# Patient Record
Sex: Female | Born: 1995 | Race: White | Hispanic: No | Marital: Single | Smoking: Never smoker
Health system: Southern US, Community
[De-identification: ages and names within clinical notes are randomized; demographics above are authoritative.]

## PROBLEM LIST (undated history)

## (undated) ENCOUNTER — Inpatient Hospital Stay (HOSPITAL_COMMUNITY): Payer: Self-pay

## (undated) DIAGNOSIS — F419 Anxiety disorder, unspecified: Secondary | ICD-10-CM

## (undated) DIAGNOSIS — Z789 Other specified health status: Secondary | ICD-10-CM

## (undated) DIAGNOSIS — F32A Depression, unspecified: Secondary | ICD-10-CM

---
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
## 2018-02-05 ENCOUNTER — Encounter: Payer: Self-pay | Admitting: Cardiovascular Disease

## 2018-02-06 ENCOUNTER — Encounter: Payer: Self-pay | Admitting: Cardiovascular Disease

## 2018-02-06 ENCOUNTER — Ambulatory Visit (INDEPENDENT_AMBULATORY_CARE_PROVIDER_SITE_OTHER): Payer: Medicaid Other | Admitting: Cardiovascular Disease

## 2018-02-06 VITALS — BP 112/82 | HR 89 | Ht 67.0 in | Wt 172.0 lb

## 2018-02-06 DIAGNOSIS — W57XXXA Bitten or stung by nonvenomous insect and other nonvenomous arthropods, initial encounter: Secondary | ICD-10-CM | POA: Diagnosis not present

## 2018-02-06 DIAGNOSIS — Z9289 Personal history of other medical treatment: Secondary | ICD-10-CM | POA: Diagnosis not present

## 2018-02-06 DIAGNOSIS — R9431 Abnormal electrocardiogram [ECG] [EKG]: Secondary | ICD-10-CM

## 2018-02-06 NOTE — Progress Notes (Signed)
CARDIOLOGY CONSULT NOTE  Patient ID: Jamie Maynard MRN: [id number] DOB/AGE: 24-Nov-1995 22 y.o.  Admit date: (Not on file) Primary Physician: Richardean Chimera, MD Referring Physician: Dr. Reuel Boom  Reason for Consultation: Abnormal ECG  HPI: Jamie Maynard is a 22 y.o. female who is being seen today for the evaluation of abnormal ECG at the request of Richardean Chimera, MD.   She was evaluated in the ED at [facility] on 02/04/18 for weakness and dizziness after a tick bite about a week ago.  I have personally reviewed all documentation, labs, radiographic and cardiovascular studies, and independently interpreted all ECG's.  According to the ED report, she experienced nausea, dizziness, weakness, headache, and itching on the abdomen at the site of the tick bite.  She was able to remove the tick but she was not sure how long the tick was embedded.  She denied fevers and chills as well as vomiting, chest pain, neck pain, shortness of breath, and palpitations.  She was mildly tachycardic at 112 bpm in the ED and blood pressure was normal.  Comprehensive metabolic panel was normal as was CBC and urinalysis.  I personally reviewed the ECG which demonstrated normal sinus rhythm, 97 bpm, with the automated ECG reading of baseline wandering inferior leads.  There were no significant abnormalities as per my review.  She has had chest heaviness ever since she was told she had to see a cardiologist.  She told me she did not have any chest pain or heaviness prior to this.  Apparently Lyme titers and additional titers for [facility] spotted fever were checked and are pending.   Family history: Father has a history of CABG, strokes, diabetes, and tobacco use.  Allergies  Allergen Reactions  . Other Hives and Rash    Glitter make-up causes rash and hives     No current outpatient medications on file.   No current facility-administered medications for this visit.     Past  Medical History:  Diagnosis Date  . ADHD (attention deficit hyperactivity disorder)     Past Surgical History:  Procedure Laterality Date  . TONSILLECTOMY AND ADENOIDECTOMY Bilateral 2003   Performed at [facility]    Social History   Socioeconomic History  . Marital status: Single    Spouse name: Not on file  . Number of children: Not on file  . Years of education: Not on file  . Highest education level: Not on file  Occupational History  . Not on file  Social Needs  . Financial resource strain: Not on file  . Food insecurity:    Worry: Not on file    Inability: Not on file  . Transportation needs:    Medical: Not on file    Non-medical: Not on file  Tobacco Use  . Smoking status: Passive Smoke Exposure - Never Smoker  . Smokeless tobacco: Never Used  Substance and Sexual Activity  . Alcohol use: No  . Drug use: No  . Sexual activity: Yes  Lifestyle  . Physical activity:    Days per week: Not on file    Minutes per session: Not on file  . Stress: Not on file  Relationships  . Social connections:    Talks on phone: Not on file    Gets together: Not on file    Attends religious service: Not on file    Active member of club or organization: Not on file    Attends  meetings of clubs or organizations: Not on file    Relationship status: Not on file  . Intimate partner violence:    Fear of current or ex partner: Not on file    Emotionally abused: Not on file    Physically abused: Not on file    Forced sexual activity: Not on file  Other Topics Concern  . Not on file  Social History Narrative   Jamie Maynard is in eleventh grade at [employer]. She has been diagnosed with ADHD and is an average student. 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    She lives with her father and 78eighteen year old brother.   HC: 54.8 cm      No outpatient medications have been marked as taking for the 02/06/18 encounter (Office  Visit) with Laqueta LindenKoneswaran, Laelyn Blumenthal A, MD.      Review of systems complete and found to be negative unless listed above in HPI    Physical exam Blood pressure 112/82, pulse 89, height 5\' 7"  (1.702 m), weight 172 lb (78 kg), SpO2 98 %. General: NAD Neck: No JVD, no thyromegaly or thyroid nodule.  Lungs: Clear to auscultation bilaterally with normal respiratory effort. CV: Nondisplaced PMI. Regular rate and rhythm, normal S1/S2, no S3/S4, no murmur.  No peripheral edema.  No carotid bruit.   Abdomen: Soft, nontender, no distention.  Skin: Intact without lesions or rashes.  Neurologic: Alert and oriented x 3.  Psych: Normal affect. Extremities: No clubbing or cyanosis.  HEENT: Normal.   ECG: Most recent ECG reviewed.   Labs: No results found for: K, BUN, CREATININE, ALT, TSH, HGB   Lipids: No results found for: LDLCALC, LDLDIRECT, CHOL, TRIG, HDL      ASSESSMENT AND PLAN:  1.  "Abnormal EKG ": I have reviewed the ECG and it is normal.  The automated reading mention baseline wandering in the inferior leads.  Intervals are normal.  No cardiac testing is indicated at this time.  2.  Tick bite: Lyme and [facility] spotted fever titers were apparently checked and are pending at this time.  I told her that if she is positive for Lyme she would likely require doxycycline.  I also told her that tick bites can sometimes lead to tachycardia or bradycardia.  She is presently experiencing neither.     Disposition: Follow up prn   Signed: Prentice DockerSuresh Kanylah Muench, M.D., F.A.C.C.  02/06/2018, 8:37 AM

## 2018-02-06 NOTE — Patient Instructions (Signed)
Medication Instructions:  Your physician recommends that you continue on your current medications as directed. Please refer to the Current Medication list given to you today.  Labwork: NONE  Testing/Procedures: NONE  Follow-Up: Your physician recommends that you schedule a follow-up appointment AS NEEDED WITH DR. KONESWARAN  Any Other Special Instructions Will Be Listed Below (If Applicable).  If you need a refill on your cardiac medications before your next appointment, please call your pharmacy. 

## 2018-05-04 ENCOUNTER — Emergency Department (HOSPITAL_COMMUNITY)
Admission: EM | Admit: 2018-05-04 | Discharge: 2018-05-04 | Disposition: A | Discharge status: Home / Self Care | Payer: Medicaid Other | Attending: Emergency Medicine | Admitting: Emergency Medicine

## 2018-05-04 ENCOUNTER — Encounter (HOSPITAL_COMMUNITY): Payer: Self-pay | Admitting: *Deleted

## 2018-05-04 ENCOUNTER — Other Ambulatory Visit: Payer: Self-pay

## 2018-05-04 DIAGNOSIS — R55 Syncope and collapse: Secondary | ICD-10-CM | POA: Diagnosis present

## 2018-05-04 DIAGNOSIS — Z7722 Contact with and (suspected) exposure to environmental tobacco smoke (acute) (chronic): Secondary | ICD-10-CM | POA: Insufficient documentation

## 2018-05-04 DIAGNOSIS — F909 Attention-deficit hyperactivity disorder, unspecified type: Secondary | ICD-10-CM | POA: Diagnosis not present

## 2018-05-04 LAB — CBC WITH DIFFERENTIAL/PLATELET
Basophils Absolute: 0.1 10*3/uL (ref 0.0–0.1)
Basophils Relative: 1 %
EOS ABS: 0 10*3/uL (ref 0.0–0.7)
Eosinophils Relative: 0 %
HCT: 43.6 % (ref 36.0–46.0)
HEMOGLOBIN: 14.2 g/dL (ref 12.0–15.0)
LYMPHS ABS: 2.1 10*3/uL (ref 0.7–4.0)
LYMPHS PCT: 22 %
MCH: 29.3 pg (ref 26.0–34.0)
MCHC: 32.6 g/dL (ref 30.0–36.0)
MCV: 89.9 fL (ref 78.0–100.0)
Monocytes Absolute: 0.7 10*3/uL (ref 0.1–1.0)
Monocytes Relative: 8 %
NEUTROS PCT: 69 %
Neutro Abs: 6.6 10*3/uL (ref 1.7–7.7)
Platelets: 283 10*3/uL (ref 150–400)
RBC: 4.85 MIL/uL (ref 3.87–5.11)
RDW: 12.8 % (ref 11.5–15.5)
WBC: 9.6 10*3/uL (ref 4.0–10.5)

## 2018-05-04 LAB — PREGNANCY, URINE: Preg Test, Ur: NEGATIVE

## 2018-05-04 LAB — HEPATIC FUNCTION PANEL
ALK PHOS: 57 U/L (ref 38–126)
ALT: 14 U/L (ref 0–44)
AST: 14 U/L — ABNORMAL LOW (ref 15–41)
Albumin: 4.7 g/dL (ref 3.5–5.0)
BILIRUBIN TOTAL: 0.4 mg/dL (ref 0.3–1.2)
Total Protein: 7.8 g/dL (ref 6.5–8.1)

## 2018-05-04 LAB — BASIC METABOLIC PANEL
Anion gap: 6 (ref 5–15)
BUN: 9 mg/dL (ref 6–20)
CHLORIDE: 106 mmol/L (ref 98–111)
CO2: 27 mmol/L (ref 22–32)
Calcium: 9.5 mg/dL (ref 8.9–10.3)
Creatinine, Ser: 0.67 mg/dL (ref 0.44–1.00)
GFR calc Af Amer: >60 mL/min (ref >60)
GFR calc non Af Amer: >60 mL/min (ref >60)
GLUCOSE: 85 mg/dL (ref 70–99)
POTASSIUM: 4 mmol/L (ref 3.5–5.1)
SODIUM: 139 mmol/L (ref 135–145)

## 2018-05-04 LAB — URINALYSIS, ROUTINE W REFLEX MICROSCOPIC
BILIRUBIN URINE: NEGATIVE
Glucose, UA: NEGATIVE mg/dL
Hgb urine dipstick: NEGATIVE
KETONES UR: NEGATIVE mg/dL
NITRITE: NEGATIVE
PH: 7 (ref 5.0–8.0)
Protein, ur: NEGATIVE mg/dL
SPECIFIC GRAVITY, URINE: 1.009 (ref 1.005–1.030)

## 2018-05-04 MED ORDER — SODIUM CHLORIDE 0.9 % IV BOLUS
1000.0000 mL | Freq: Once | INTRAVENOUS | Status: AC
  Administered 2018-05-04: 1000 mL via INTRAVENOUS

## 2018-05-04 NOTE — ED Triage Notes (Signed)
Pt states she passed out at work and not feeling well, asked a friend to come and her friend witnessed pt's loc.  Pt denies any hx of loc.  Pt states she has not been feeling well all morning with nausea and generalized weakness.

## 2018-05-04 NOTE — Discharge Instructions (Addendum)
Take your usual prescriptions as previously directed.  Increase your fluid intake (ie:  Gatoraide) for the next few days. Move slowly when changing positions.  Call your regular medical doctor Monday to schedule a follow up appointment this week.  Return to the Emergency Department immediately sooner if worsening.

## 2018-05-04 NOTE — ED Notes (Signed)
Pt given Sprite to drink for fluid challenge. Pt tolerating well.

## 2018-05-04 NOTE — ED Provider Notes (Signed)
[facility] EMERGENCY DEPARTMENT Provider Note   CSN: [id number] Arrival date & time: 05/04/18  1547     History   Chief Complaint Chief Complaint  Patient presents with  . Loss of Consciousness    witnessed    HPI Jamie Maynard is a 22 y.o. female.  HPI  Pt was seen at 1735. Per pt, c/o sudden onset and resolution of one episode of brief syncope that occurred PTA. Pt states she was in the bathroom, finished urinating and started to wash her hands. Pt states she then began to feel lightheaded.  States she texted her friend that she felt like she was going to pass out.  Pt then sat down on the floor as pt's friend arrived into the bathroom "and saw me pass out." Pt did not fall. Pt states she has been feeling generally weak and mildly nauseated since this morning. Denies abd pain, no N/V/D, no CP/palpitations, no SOB/cough, no headache, no visual changes, no focal motor weakness, no tingling/numbness in extremities, no ataxia, no slurred speech, no facial droop.    Past Medical History:  Diagnosis Date  . ADHD (attention deficit hyperactivity disorder)     Patient Active Problem List   Diagnosis Date Noted  . Urinary incontinence 09/08/2015  . Urinary retention 09/08/2015    Past Surgical History:  Procedure Laterality Date  . TONSILLECTOMY AND ADENOIDECTOMY Bilateral 2003   Performed at [facility]     OB History   None      Home Medications    Prior to Admission medications   Not on File    Family History Family History  Problem Relation Age of Onset  . Depression Father   . Heart attack Maternal Grandfather   . Congestive Heart Failure Paternal Grandmother   . Migraines Paternal Grandmother   . Depression Paternal Grandmother   . Lung cancer Paternal Grandfather   . Autism Cousin        2 paternal first cousins have autism  . ADD / ADHD Cousin        Several paternal first cousins have ADHD/ADD    Social History Social History     Tobacco Use  . Smoking status: Passive Smoke Exposure - Never Smoker  . Smokeless tobacco: Never Used  Substance Use Topics  . Alcohol use: No  . Drug use: No     Allergies   Other   Review of Systems Review of Systems ROS: Statement: All systems negative except as marked or noted in the HPI; Constitutional: Negative for fever and chills. +generalized weakness/fatigue.; ; Eyes: Negative for eye pain, redness and discharge. ; ; ENMT: Negative for ear pain, hoarseness, nasal congestion, sinus pressure and sore throat. ; ; Cardiovascular: Negative for chest pain, palpitations, diaphoresis, dyspnea and peripheral edema. ; ; Respiratory: Negative for cough, wheezing and stridor. ; ; Gastrointestinal: +nausea. Negative for vomiting, diarrhea, abdominal pain, blood in stool, hematemesis, jaundice and rectal bleeding. . ; ; Genitourinary: Negative for dysuria, flank pain and hematuria. ; ; Musculoskeletal: Negative for back pain and neck pain. Negative for swelling and trauma.; ; Skin: Negative for pruritus, rash, abrasions, blisters, bruising and skin lesion.; ; Neuro: Negative for headache and neck stiffness. Negative for extremity weakness, paresthesias, involuntary movement, seizure and +lightheadedness, syncope.      Physical Exam Updated Vital Signs BP 120/64   Pulse 86   Temp 98.5 F (36.9 C) (Oral)   Resp 16   Ht 5\' 7"  (1.702 m)  Wt 73.5 kg (162 lb)   LMP 04/13/2018   SpO2 100%   BMI 25.37 kg/m    Patient Vitals for the past 24 hrs:  BP Temp Temp src Pulse Resp SpO2 Height Weight  05/04/18 1845 120/64 - - 86 - 100 % - -  05/04/18 1701 108/70 - - 89 16 100 % - -  05/04/18 1700 108/70 - - - - - - -  05/04/18 1603 - 98.5 F (36.9 C) Oral 92 - 99 % - -  05/04/18 1602 - - - - - - 5\' 7"  (1.702 m) 73.5 kg (162 lb)  05/04/18 1601 123/77 98.5 F (36.9 C) - 90 18 99 % - -     18:44 Orthostatic Vital Signs VB  Orthostatic Lying   BP- Lying: 115/66   Pulse- Lying: 87        Orthostatic Sitting  BP- Sitting: 120/64   Pulse- Sitting: 90       Orthostatic Standing at 0 minutes  BP- Standing at 0 minutes: 131/71   Pulse- Standing at 0 minutes: 92      Physical Exam 1740: Physical examination:  Nursing notes reviewed; Vital signs and O2 SAT reviewed;  Constitutional: Well developed, Well nourished, Well hydrated, In no acute distress; Head:  Normocephalic, atraumatic; Eyes: EOMI, PERRL, No scleral icterus; ENMT: Mouth and pharynx normal, Mucous membranes moist; Neck: Supple, Full range of motion, No lymphadenopathy; Cardiovascular: Regular rate and rhythm, No gallop; Respiratory: Breath sounds clear & equal bilaterally, No wheezes.  Speaking full sentences with ease, Normal respiratory effort/excursion; Chest: Nontender, Movement normal; Abdomen: Soft, Nontender, Nondistended, Normal bowel sounds; Genitourinary: No CVA tenderness; Extremities: Peripheral pulses normal, No tenderness, No edema, No calf edema or asymmetry.; Neuro: AA&Ox3, Major CN grossly intact. No facial droop. Speech clear. No gross focal motor or sensory deficits in extremities.; Skin: Color normal, Warm, Dry.   ED Treatments / Results  Labs (all labs ordered are listed, but only abnormal results are displayed)   EKG EKG Interpretation  Date/Time:  Sunday May 04 2018 16:04:04 EDT Ventricular Rate:  87 PR Interval:  152 QRS Duration: 80 QT Interval:  344 QTC Calculation: 413 R Axis:   55 Text Interpretation:  Normal sinus rhythm Possible Left atrial enlargement No old tracing to compare Confirmed by Samuel Jester [phone]) on 05/04/2018 5:55:08 PM   Radiology   Procedures Procedures (including critical care time)  Medications Ordered in ED Medications  sodium chloride 0.9 % bolus 1,000 mL (0 mLs Intravenous Stopped 05/04/18 1852)     Initial Impression / Assessment and Plan / ED Course  I have reviewed the triage vital signs and the nursing notes.  Pertinent labs &  imaging results that were available during my care of the patient were reviewed by me and considered in my medical decision making (see chart for details).  MDM Reviewed: previous chart, nursing note and vitals Interpretation: labs and ECG   Results for orders placed or performed during the hospital encounter of 05/04/18  Urinalysis, Routine w reflex microscopic  Result Value Ref Range   Color, Urine YELLOW YELLOW   APPearance CLEAR CLEAR   Specific Gravity, Urine 1.009 1.005 - 1.030   pH 7.0 5.0 - 8.0   Glucose, UA NEGATIVE NEGATIVE mg/dL   Hgb urine dipstick NEGATIVE NEGATIVE   Bilirubin Urine NEGATIVE NEGATIVE   Ketones, ur NEGATIVE NEGATIVE mg/dL   Protein, ur NEGATIVE NEGATIVE mg/dL   Nitrite NEGATIVE NEGATIVE   Leukocytes, UA SMALL (A) NEGATIVE  RBC / HPF 0-5 0 - 5 RBC/hpf   WBC, UA 0-5 0 - 5 WBC/hpf   Bacteria, UA RARE (A) NONE SEEN   Squamous Epithelial / LPF 6-10 0 - 5  Pregnancy, urine  Result Value Ref Range   Preg Test, Ur NEGATIVE NEGATIVE  CBC with Differential  Result Value Ref Range   WBC 9.6 4.0 - 10.5 K/uL   RBC 4.85 3.87 - 5.11 MIL/uL   Hemoglobin 14.2 12.0 - 15.0 g/dL   HCT 09.843.6 11.936.0 - 14.746.0 %   MCV 89.9 78.0 - 100.0 fL   MCH 29.3 26.0 - 34.0 pg   MCHC 32.6 30.0 - 36.0 g/dL   RDW 82.912.8 56.211.5 - 13.015.5 %   Platelets 283 150 - 400 K/uL   Neutrophils Relative % 69 %   Neutro Abs 6.6 1.7 - 7.7 K/uL   Lymphocytes Relative 22 %   Lymphs Abs 2.1 0.7 - 4.0 K/uL   Monocytes Relative 8 %   Monocytes Absolute 0.7 0.1 - 1.0 K/uL   Eosinophils Relative 0 %   Eosinophils Absolute 0.0 0.0 - 0.7 K/uL   Basophils Relative 1 %   Basophils Absolute 0.1 0.0 - 0.1 K/uL  Basic metabolic panel  Result Value Ref Range   Sodium 139 135 - 145 mmol/L   Potassium 4.0 3.5 - 5.1 mmol/L   Chloride 106 98 - 111 mmol/L   CO2 27 22 - 32 mmol/L   Glucose, Bld 85 70 - 99 mg/dL   BUN 9 6 - 20 mg/dL   Creatinine, Ser 8.650.67 0.44 - 1.00 mg/dL   Calcium 9.5 8.9 - 78.410.3 mg/dL   GFR calc  non Af Amer >60 >60 mL/min   GFR calc Af Amer >60 >60 mL/min   Anion gap 6 5 - 15  Hepatic function panel  Result Value Ref Range   Total Protein 7.8 6.5 - 8.1 g/dL   Albumin 4.7 3.5 - 5.0 g/dL   AST 14 (L) 15 - 41 U/L   ALT 14 0 - 44 U/L   Alkaline Phosphatase 57 38 - 126 U/L   Total Bilirubin 0.4 0.3 - 1.2 mg/dL   Bilirubin, Direct <6.9<0.1 0.0 - 0.2 mg/dL   Indirect Bilirubin NOT CALCULATED 0.3 - 0.9 mg/dL    [id number]:  Pt received IVF bolus. Not orthostatic on VS. Workup reassuring. Has tol PO well. Has ambulated with steady gait. States she feels better and is ready to go home now. Low risk syncope scores. Dx and testing d/w pt.  Questions answered.  Verb understanding, agreeable to d/c home with outpt f/u.   Final Clinical Impressions(s) / ED Diagnoses   Final diagnoses:  None    ED Discharge Orders    None       Samuel JesterMcManus, Gyanna Jarema, DO 05/08/18 1425

## 2018-05-04 NOTE — ED Notes (Signed)
Patient transported to CT 

## 2018-11-05 NOTE — L&D Delivery Note (Signed)
OB/GYN Faculty Practice Delivery Note  Jamie Maynard is a 23 y.o. G2P1001 s/p SVD and post-placental IUD placement at [redacted]w[redacted]d. She was admitted for IOL for gHTN.   ROM: 0h 57m with clear fluid GBS Status: Negative   Maximum Maternal Temperature: 98.79F  Labor Progress: . Patient presented to L&D for IOL secondary to gHTN. Initial SVE: 3/50%/-3. Patient received Cytotec, Foley bulb and Pitocin. Patient received Epidural and then had SROM. She then progressed to complete.   Delivery Date/Time: 9/4 at 1607 Delivery: Called to room and patient was complete and pushing. Head delivered in LOA position. No nuchal cord present. Shoulder and body delivered in usual fashion. Infant with spontaneous cry, placed on mother's abdomen, dried and stimulated. Cord clamped x 2 after 1-minute delay, and cut by FOB. Cord blood drawn. Placenta delivered spontaneously with gentle cord traction. Fundus firm with massage and Pitocin. Labia, perineum, vagina, and cervix inspected inspected with no lacerations. Patient identified, informed consent performed, consent signed upon admission. Discussed risks of irregular bleeding, cramping, infection, malpositioning or misplacement of the IUD outside the uterus which may require further procedure such as laparoscopy. Also discussed >99% contraception efficacy, increased risk of ectopic pregnancy with failure of method.  Liletta IUD ejected from inserter and placed manually to uterine fundus.  Strings trimmed to 3 cm. Lot #: [id number] Exp: 01/2023   Baby Weight: pending  Placenta: Sent to L&D Complications: None Lacerations: None EBL: 50 mL Analgesia: Epidural   Infant: APGAR (1 MIN): 9  APGAR (5 MINS): 9 APGAR (10 MINS):     Barrington Ellison, MD OB Family Medicine Fellow, [facility] for [facility], [facility] 07/10/2019, 4:40 PM

## 2018-11-25 ENCOUNTER — Encounter: Payer: Self-pay | Admitting: Adult Health

## 2018-12-22 ENCOUNTER — Encounter (INDEPENDENT_AMBULATORY_CARE_PROVIDER_SITE_OTHER): Payer: Self-pay

## 2018-12-22 ENCOUNTER — Ambulatory Visit (INDEPENDENT_AMBULATORY_CARE_PROVIDER_SITE_OTHER): Payer: Medicaid Other | Admitting: Adult Health

## 2018-12-22 ENCOUNTER — Encounter: Payer: Self-pay | Admitting: Adult Health

## 2018-12-22 VITALS — BP 111/68 | HR 90 | Ht 67.0 in | Wt 172.0 lb

## 2018-12-22 DIAGNOSIS — N926 Irregular menstruation, unspecified: Secondary | ICD-10-CM

## 2018-12-22 DIAGNOSIS — Z3201 Encounter for pregnancy test, result positive: Secondary | ICD-10-CM | POA: Diagnosis not present

## 2018-12-22 DIAGNOSIS — R112 Nausea with vomiting, unspecified: Secondary | ICD-10-CM | POA: Insufficient documentation

## 2018-12-22 DIAGNOSIS — O3680X0 Pregnancy with inconclusive fetal viability, not applicable or unspecified: Secondary | ICD-10-CM

## 2018-12-22 DIAGNOSIS — R110 Nausea: Secondary | ICD-10-CM

## 2018-12-22 DIAGNOSIS — Z3A01 Less than 8 weeks gestation of pregnancy: Secondary | ICD-10-CM

## 2018-12-22 LAB — POCT URINE PREGNANCY: PREG TEST UR: POSITIVE — AB

## 2018-12-22 MED ORDER — PRENATAL PLUS 27-1 MG PO TABS: 1.0000 | ORAL_TABLET | Freq: Every day | ORAL | 12 refills | Status: DC

## 2018-12-22 MED ORDER — PROMETHAZINE HCL 25 MG PO TABS: 25.0000 mg | ORAL_TABLET | Freq: Four times a day (QID) | ORAL | 1 refills | Status: DC | PRN

## 2018-12-22 NOTE — Progress Notes (Signed)
Patient ID: Jamie Maynard, female   DOB: Sep 02, 1996, 23 y.o.   MRN: [id number] History of Present Illness: Jamie Maynard is a 23 year old white female,single in for UPT, has missed a period and had 3-4 +HPTs.She works at [employer].  PCP is Dr Reuel Boom.   Current Medications, Allergies, Past Medical History, Past Surgical History, Family History and Social History were reviewed in [employer] record.     Review of Systems: Missed period with 3-4 +HPTs +nausea, no vomiting     Physical Exam:BP 111/68 (BP Location: Right Arm, Patient Position: Sitting, Cuff Size: Normal)   Pulse 90   Ht 5\' 7"  (1.702 m)   Wt 172 lb (78 kg)   LMP 10/28/2018   BMI 26.94 kg/m   UPT +, about 7+6 weeks by LMP with EDD 08/05/2019 General:  Well developed, well nourished, no acute distress Skin:  Warm and dry Neck:  Midline trachea, normal thyroid, good ROM, no lymphadenopathy Lungs; Clear to auscultation bilaterally Cardiovascular: Regular rate and rhythm Abdomen:  Soft, non tender Psych:  No mood changes, alert and cooperative,seems happy Fall risk is low.   Impression:  1. Pregnancy test positive   2. Less than [redacted] weeks gestation of pregnancy   3. Encounter to determine fetal viability of pregnancy, single or unspecified fetus   4. Nausea      Plan: Meds ordered this encounter  Medications  . promethazine (PHENERGAN) 25 MG tablet    Sig: Take 1 tablet (25 mg total) by mouth every 6 (six) hours as needed for nausea or vomiting.    Dispense:  30 tablet    Refill:  1    Order Specific Question:   Supervising Provider    Answer:   Despina Hidden, LUTHER H [[id number]]  . prenatal vitamin w/FE, FA (PRENATAL 1 + 1) 27-1 MG TABS tablet    Sig: Take 1 tablet by mouth daily at 12 noon.    Dispense:  30 each    Refill:  12    Order Specific Question:   Supervising Provider    Answer:   Lazaro Arms [[id number]]  Eat often Return in 1 week for dating US/3 weeks new OB Review handouts on  First trimester and by [facility]

## 2018-12-22 NOTE — Patient Instructions (Signed)
First Trimester of Pregnancy  The first trimester of pregnancy is from week 1 until the end of week 13 (months 1 through 3). A week after a sperm fertilizes an egg, the egg will implant on the wall of the uterus. This embryo will begin to develop into a baby. Genes from you and your partner will form the baby. The female genes will determine whether the baby will be a boy or a girl. At 6-8 weeks, the eyes and face will be formed, and the heartbeat can be seen on ultrasound. At the end of 12 weeks, all the baby's organs will be formed.  Now that you are pregnant, you will want to do everything you can to have a healthy baby. Two of the most important things are to get good prenatal care and to follow your health care provider's instructions. Prenatal care is all the medical care you receive before the baby's birth. This care will help prevent, find, and treat any problems during the pregnancy and childbirth.  Body changes during your first trimester  Your body goes through many changes during pregnancy. The changes vary from woman to woman.   You may gain or lose a couple of pounds at first.   You may feel sick to your stomach (nauseous) and you may throw up (vomit). If the vomiting is uncontrollable, call your health care provider.   You may tire easily.   You may develop headaches that can be relieved by medicines. All medicines should be approved by your health care provider.   You may urinate more often. Painful urination may mean you have a bladder infection.   You may develop heartburn as a result of your pregnancy.   You may develop constipation because certain hormones are causing the muscles that push stool through your intestines to slow down.   You may develop hemorrhoids or swollen veins (varicose veins).   Your breasts may begin to grow larger and become tender. Your nipples may stick out more, and the tissue that surrounds them (areola) may become darker.   Your gums may bleed and may be  sensitive to brushing and flossing.   Dark spots or blotches (chloasma, mask of pregnancy) may develop on your face. This will likely fade after the baby is born.   Your menstrual periods will stop.   You may have a loss of appetite.   You may develop cravings for certain kinds of food.   You may have changes in your emotions from day to day, such as being excited to be pregnant or being concerned that something may go wrong with the pregnancy and baby.   You may have more vivid and strange dreams.   You may have changes in your hair. These can include thickening of your hair, rapid growth, and changes in texture. Some women also have hair loss during or after pregnancy, or hair that feels dry or thin. Your hair will most likely return to normal after your baby is born.  What to expect at prenatal visits  During a routine prenatal visit:   You will be weighed to make sure you and the baby are growing normally.   Your blood pressure will be taken.   Your abdomen will be measured to track your baby's growth.   The fetal heartbeat will be listened to between weeks 10 and 14 of your pregnancy.   Test results from any previous visits will be discussed.  Your health care provider may ask you:     How you are feeling.   If you are feeling the baby move.   If you have had any abnormal symptoms, such as leaking fluid, bleeding, severe headaches, or abdominal cramping.   If you are using any tobacco products, including cigarettes, chewing tobacco, and electronic cigarettes.   If you have any questions.  Other tests that may be performed during your first trimester include:   Blood tests to find your blood type and to check for the presence of any previous infections. The tests will also be used to check for low iron levels (anemia) and protein on red blood cells (Rh antibodies). Depending on your risk factors, or if you previously had diabetes during pregnancy, you may have tests to check for high blood sugar  that affects pregnant women (gestational diabetes).   Urine tests to check for infections, diabetes, or protein in the urine.   An ultrasound to confirm the proper growth and development of the baby.   Fetal screens for spinal cord problems (spina bifida) and Down syndrome.   HIV (human immunodeficiency virus) testing. Routine prenatal testing includes screening for HIV, unless you choose not to have this test.   You may need other tests to make sure you and the baby are doing well.  Follow these instructions at home:  Medicines   Follow your health care provider's instructions regarding medicine use. Specific medicines may be either safe or unsafe to take during pregnancy.   Take a prenatal vitamin that contains at least 600 micrograms (mcg) of folic acid.   If you develop constipation, try taking a stool softener if your health care provider approves.  Eating and drinking     Eat a balanced diet that includes fresh fruits and vegetables, whole grains, good sources of protein such as meat, eggs, or tofu, and low-fat dairy. Your health care provider will help you determine the amount of weight gain that is right for you.   Avoid raw meat and uncooked cheese. These carry germs that can cause birth defects in the baby.   Eating four or five small meals rather than three large meals a day may help relieve nausea and vomiting. If you start to feel nauseous, eating a few soda crackers can be helpful. Drinking liquids between meals, instead of during meals, also seems to help ease nausea and vomiting.   Limit foods that are high in fat and processed sugars, such as fried and sweet foods.   To prevent constipation:  ? Eat foods that are high in fiber, such as fresh fruits and vegetables, whole grains, and beans.  ? Drink enough fluid to keep your urine clear or pale yellow.  Activity   Exercise only as directed by your health care provider. Most women can continue their usual exercise routine during  pregnancy. Try to exercise for 30 minutes at least 5 days a week. Exercising will help you:  ? Control your weight.  ? Stay in shape.  ? Be prepared for labor and delivery.   Experiencing pain or cramping in the lower abdomen or lower back is a good sign that you should stop exercising. Check with your health care provider before continuing with normal exercises.   Try to avoid standing for long periods of time. Move your legs often if you must stand in one place for a long time.   Avoid heavy lifting.   Wear low-heeled shoes and practice good posture.   You may continue to have sex unless your health care   provider tells you not to.  Relieving pain and discomfort   Wear a good support bra to relieve breast tenderness.   Take warm sitz baths to soothe any pain or discomfort caused by hemorrhoids. Use hemorrhoid cream if your health care provider approves.   Rest with your legs elevated if you have leg cramps or low back pain.   If you develop varicose veins in your legs, wear support hose. Elevate your feet for 15 minutes, 3-4 times a day. Limit salt in your diet.  Prenatal care   Schedule your prenatal visits by the twelfth week of pregnancy. They are usually scheduled monthly at first, then more often in the last 2 months before delivery.   Write down your questions. Take them to your prenatal visits.   Keep all your prenatal visits as told by your health care provider. This is important.  Safety   Wear your seat belt at all times when driving.   Make a list of emergency phone numbers, including numbers for family, friends, the hospital, and police and fire departments.  General instructions   Ask your health care provider for a referral to a local prenatal education class. Begin classes no later than the beginning of month 6 of your pregnancy.   Ask for help if you have counseling or nutritional needs during pregnancy. Your health care provider can offer advice or refer you to specialists for help  with various needs.   Do not use hot tubs, steam rooms, or saunas.   Do not douche or use tampons or scented sanitary pads.   Do not cross your legs for long periods of time.   Avoid cat litter boxes and soil used by cats. These carry germs that can cause birth defects in the baby and possibly loss of the fetus by miscarriage or stillbirth.   Avoid all smoking, herbs, alcohol, and medicines not prescribed by your health care provider. Chemicals in these products affect the formation and growth of the baby.   Do not use any products that contain nicotine or tobacco, such as cigarettes and e-cigarettes. If you need help quitting, ask your health care provider. You may receive counseling support and other resources to help you quit.   Schedule a dentist appointment. At home, brush your teeth with a soft toothbrush and be gentle when you floss.  Contact a health care provider if:   You have dizziness.   You have mild pelvic cramps, pelvic pressure, or nagging pain in the abdominal area.   You have persistent nausea, vomiting, or diarrhea.   You have a bad smelling vaginal discharge.   You have pain when you urinate.   You notice increased swelling in your face, hands, legs, or ankles.   You are exposed to fifth disease or chickenpox.   You are exposed to German measles (rubella) and have never had it.  Get help right away if:   You have a fever.   You are leaking fluid from your vagina.   You have spotting or bleeding from your vagina.   You have severe abdominal cramping or pain.   You have rapid weight gain or loss.   You vomit blood or material that looks like coffee grounds.   You develop a severe headache.   You have shortness of breath.   You have any kind of trauma, such as from a fall or a car accident.  Summary   The first trimester of pregnancy is from week 1 until   the end of week 13 (months 1 through 3).   Your body goes through many changes during pregnancy. The changes vary from  woman to woman.   You will have routine prenatal visits. During those visits, your health care provider will examine you, discuss any test results you may have, and talk with you about how you are feeling.  This information is not intended to replace advice given to you by your health care provider. Make sure you discuss any questions you have with your health care provider.  Document Released: 10/16/2001 Document Revised: 10/03/2016 Document Reviewed: 10/03/2016  Elsevier Interactive Patient Education  2019 [employer]

## 2019-01-05 ENCOUNTER — Ambulatory Visit (INDEPENDENT_AMBULATORY_CARE_PROVIDER_SITE_OTHER): Payer: Medicaid Other

## 2019-01-05 DIAGNOSIS — O3680X0 Pregnancy with inconclusive fetal viability, not applicable or unspecified: Secondary | ICD-10-CM | POA: Diagnosis not present

## 2019-01-05 DIAGNOSIS — Z3A10 10 weeks gestation of pregnancy: Secondary | ICD-10-CM | POA: Diagnosis not present

## 2019-01-05 NOTE — Progress Notes (Signed)
[place] 10+3 wks,single IUP,CRL 32.93 mm,fhr 169 bpm,normal ovaries bilat

## 2019-01-08 ENCOUNTER — Ambulatory Visit: Payer: Medicaid Other | Admitting: *Deleted

## 2019-01-08 ENCOUNTER — Other Ambulatory Visit: Payer: Self-pay

## 2019-01-08 VITALS — BP 117/78 | HR 88 | Wt 170.0 lb

## 2019-01-08 DIAGNOSIS — Z331 Pregnant state, incidental: Secondary | ICD-10-CM

## 2019-01-08 DIAGNOSIS — Z1389 Encounter for screening for other disorder: Secondary | ICD-10-CM

## 2019-01-08 LAB — POCT URINALYSIS DIPSTICK OB
Glucose, UA: NEGATIVE
Ketones, UA: NEGATIVE
LEUKOCYTES UA: NEGATIVE
Nitrite, UA: NEGATIVE
PROTEIN: NEGATIVE
RBC UA: NEGATIVE
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
## 2019-01-12 ENCOUNTER — Ambulatory Visit (INDEPENDENT_AMBULATORY_CARE_PROVIDER_SITE_OTHER): Payer: Medicaid Other | Admitting: Women's Health

## 2019-01-12 ENCOUNTER — Encounter: Payer: Self-pay | Admitting: Women's Health

## 2019-01-12 ENCOUNTER — Other Ambulatory Visit: Payer: Self-pay

## 2019-01-12 ENCOUNTER — Ambulatory Visit: Payer: Medicaid Other | Admitting: *Deleted

## 2019-01-12 VITALS — BP 114/77 | HR 97 | Wt 166.0 lb

## 2019-01-12 DIAGNOSIS — Z1389 Encounter for screening for other disorder: Secondary | ICD-10-CM

## 2019-01-12 DIAGNOSIS — Z1379 Encounter for other screening for genetic and chromosomal anomalies: Secondary | ICD-10-CM

## 2019-01-12 DIAGNOSIS — Z3481 Encounter for supervision of other normal pregnancy, first trimester: Secondary | ICD-10-CM

## 2019-01-12 DIAGNOSIS — Z3A11 11 weeks gestation of pregnancy: Secondary | ICD-10-CM

## 2019-01-12 DIAGNOSIS — Z331 Pregnant state, incidental: Secondary | ICD-10-CM

## 2019-01-12 DIAGNOSIS — Z3682 Encounter for antenatal screening for nuchal translucency: Secondary | ICD-10-CM

## 2019-01-12 DIAGNOSIS — Z3490 Encounter for supervision of normal pregnancy, unspecified, unspecified trimester: Secondary | ICD-10-CM | POA: Insufficient documentation

## 2019-01-12 LAB — POCT URINALYSIS DIPSTICK OB
Glucose, UA: NEGATIVE
KETONES UA: NEGATIVE
Leukocytes, UA: NEGATIVE
NITRITE UA: NEGATIVE
RBC UA: NEGATIVE

## 2019-01-12 NOTE — Progress Notes (Signed)
INITIAL OBSTETRICAL VISIT Patient name: Jamie Maynard MRN [id number]  Date of birth: 06-02-1996 Chief Complaint:   Initial Prenatal Visit  History of Present Illness:   Jamie Maynard is a 23 y.o. G43P1001 Caucasian female at [redacted]w[redacted]d by LMP c/w 10wk u/s, with an Estimated Date of Delivery: 07/31/19 being seen today for her initial obstetrical visit.   Her obstetrical history is significant for term uncomplicated SVB x 1.   Today she reports no complaints.  Patient's last menstrual period was 10/24/2018 (exact date). Last pap <21yo. Results were: n/a Review of Systems:   Pertinent items are noted in HPI Denies cramping/contractions, leakage of fluid, vaginal bleeding, abnormal vaginal discharge w/ itching/odor/irritation, headaches, visual changes, shortness of breath, chest pain, abdominal pain, severe nausea/vomiting, or problems with urination or bowel movements unless otherwise stated above.  Pertinent History Reviewed:  Reviewed past medical,surgical, social, obstetrical and family history.  Reviewed problem list, medications and allergies. OB History  Gravida Para Term Preterm AB Living  2 1 1     1   SAB TAB Ectopic Multiple Live Births          1    # Outcome Date GA Lbr Len/2nd Weight Sex Delivery Anes PTL Lv  2 Current           1 Term 10/05/16 [redacted]w[redacted]d  6 lb 9 oz (2.977 kg) F  EPI N LIV   Physical Assessment:   Vitals:   01/12/19 1155  BP: 114/77  Pulse: 97  Weight: 166 lb (75.3 kg)  Body mass index is 26 kg/m.       Physical Examination:  General appearance - well appearing, and in no distress  Mental status - alert, oriented to person, place, and time  Psych:  She has a normal mood and affect  Skin - warm and dry, normal color, no suspicious lesions noted  Chest - effort normal, all lung fields clear to auscultation bilaterally  Heart - normal rate and regular rhythm  Abdomen - soft, nontender  Extremities:  No swelling or varicosities noted  Thin prep pap  is not done  Fetal Heart Rate (bpm): +u/s via informal transabdominal u/s  Results for orders placed or performed in visit on 01/12/19 (from the past 24 hour(s))  POC Urinalysis Dipstick OB   Collection Time: 01/12/19 12:13 PM  Result Value Ref Range   Color, UA     Clarity, UA     Glucose, UA Negative Negative   Bilirubin, UA     Ketones, UA neg    Spec Grav, UA     Blood, UA neg    pH, UA     POC,PROTEIN,UA Trace Negative, Trace, Small (1+), Moderate (2+), Large (3+), 4+   Urobilinogen, UA     Nitrite, UA neg    Leukocytes, UA Negative Negative   Appearance     Odor      Assessment & Plan:  1) Low-Risk Pregnancy G2P1001 at [redacted]w[redacted]d with an Estimated Date of Delivery: 07/31/19   2) Initial OB visit  Meds: No orders of the defined types were placed in this encounter.  Initial labs obtained Continue prenatal vitamins Reviewed n/v relief measures and warning s/s to report Reviewed recommended weight gain based on pre-gravid BMI Encouraged well-balanced diet Genetic Screening discussed: requests MaterniT21 & nt/it Cystic fibrosis, SMA, Fragile X screening discussed requested Ultrasound discussed; fetal survey: requested CCNC completed>PCM not here  Follow-up: Return in about 2 weeks (around 01/26/2019) for LROB, US:NT+1stIT.  Orders Placed This Encounter  Procedures  . GC/Chlamydia Probe Amp  . Urine Culture  . US Fetal Nuchal Translucency Measurement  . Obstetric Panel, Including HIV  . Urinalysis, Routine w reflex microscopic  . Sickle cell screen  . Pain Management Screening Profile (10S)  . Inheritest Core(CF97,SMA,FraX)  . MaterniT 21 plus Core, Blood  . POC Urinalysis Dipstick OB    Cheral Marker CNM, [facility] 01/12/2019 1:13 PM

## 2019-01-12 NOTE — Patient Instructions (Signed)
Jamie Maynard, I greatly value your feedback.  If you receive a survey following your visit with [place] today, we appreciate you taking the time to fill it out.  Thanks, Joellyn Haff, CNM, WHNP-BC   Nausea & Vomiting  Have saltine crackers or pretzels by your bed and eat a few bites before you raise your head out of bed in the morning  Eat small frequent meals throughout the day instead of large meals  Drink plenty of fluids throughout the day to stay hydrated, just don't drink a lot of fluids with your meals.  This can make your stomach fill up faster making you feel sick  Do not brush your teeth right after you eat  Products with real ginger are good for nausea, like ginger ale and ginger hard candy Make sure it says made with real ginger!  Sucking on sour candy like lemon heads is also good for nausea  If your prenatal vitamins make you nauseated, take them at night so you will sleep through the nausea  Sea Bands  If you feel like you need medicine for the nausea & vomiting please let us know  If you are unable to keep any fluids or food down please let us know   Constipation  Drink plenty of fluid, preferably water, throughout the day  Eat foods high in fiber such as fruits, vegetables, and grains  Exercise, such as walking, is a good way to keep your bowels regular  Drink warm fluids, especially warm prune juice, or decaf coffee  Eat a 1/2 cup of real oatmeal (not instant), 1/2 cup applesauce, and 1/2-1 cup warm prune juice every day  If needed, you may take Colace (docusate sodium) stool softener once or twice a day to help keep the stool soft. If you are pregnant, wait until you are out of your first trimester (12-14 weeks of pregnancy)  If you still are having problems with constipation, you may take Miralax once daily as needed to help keep your bowels regular.  If you are pregnant, wait until you are out of your first trimester (12-14 weeks of pregnancy)   First  Trimester of Pregnancy The first trimester of pregnancy is from week 1 until the end of week 12 (months 1 through 3). A week after a sperm fertilizes an egg, the egg will implant on the wall of the uterus. This embryo will begin to develop into a baby. Genes from you and your partner are forming the baby. The female genes determine whether the baby is a boy or a girl. At 6-8 weeks, the eyes and face are formed, and the heartbeat can be seen on ultrasound. At the end of 12 weeks, all the baby's organs are formed.  Now that you are pregnant, you will want to do everything you can to have a healthy baby. Two of the most important things are to get good prenatal care and to follow your health care provider's instructions. Prenatal care is all the medical care you receive before the baby's birth. This care will help prevent, find, and treat any problems during the pregnancy and childbirth. BODY CHANGES Your body goes through many changes during pregnancy. The changes vary from woman to woman.   You may gain or lose a couple of pounds at first.  You may feel sick to your stomach (nauseous) and throw up (vomit). If the vomiting is uncontrollable, call your health care provider.  You may tire easily.  You may develop headaches  that can be relieved by medicines approved by your health care provider.  You may urinate more often. Painful urination may mean you have a bladder infection.  You may develop heartburn as a result of your pregnancy.  You may develop constipation because certain hormones are causing the muscles that push waste through your intestines to slow down.  You may develop hemorrhoids or swollen, bulging veins (varicose veins).  Your breasts may begin to grow larger and become tender. Your nipples may stick out more, and the tissue that surrounds them (areola) may become darker.  Your gums may bleed and may be sensitive to brushing and flossing.  Dark spots or blotches (chloasma, mask  of pregnancy) may develop on your face. This will likely fade after the baby is born.  Your menstrual periods will stop.  You may have a loss of appetite.  You may develop cravings for certain kinds of food.  You may have changes in your emotions from day to day, such as being excited to be pregnant or being concerned that something may go wrong with the pregnancy and baby.  You may have more vivid and strange dreams.  You may have changes in your hair. These can include thickening of your hair, rapid growth, and changes in texture. Some women also have hair loss during or after pregnancy, or hair that feels dry or thin. Your hair will most likely return to normal after your baby is born. WHAT TO EXPECT AT YOUR PRENATAL VISITS During a routine prenatal visit:  You will be weighed to make sure you and the baby are growing normally.  Your blood pressure will be taken.  Your abdomen will be measured to track your baby's growth.  The fetal heartbeat will be listened to starting around week 10 or 12 of your pregnancy.  Test results from any previous visits will be discussed. Your health care provider may ask you:  How you are feeling.  If you are feeling the baby move.  If you have had any abnormal symptoms, such as leaking fluid, bleeding, severe headaches, or abdominal cramping.  If you have any questions. Other tests that may be performed during your first trimester include:  Blood tests to find your blood type and to check for the presence of any previous infections. They will also be used to check for low iron levels (anemia) and Rh antibodies. Later in the pregnancy, blood tests for diabetes will be done along with other tests if problems develop.  Urine tests to check for infections, diabetes, or protein in the urine.  An ultrasound to confirm the proper growth and development of the baby.  An amniocentesis to check for possible genetic problems.  Fetal screens for spina  bifida and Down syndrome.  You may need other tests to make sure you and the baby are doing well. HOME CARE INSTRUCTIONS  Medicines  Follow your health care provider's instructions regarding medicine use. Specific medicines may be either safe or unsafe to take during pregnancy.  Take your prenatal vitamins as directed.  If you develop constipation, try taking a stool softener if your health care provider approves. Diet  Eat regular, well-balanced meals. Choose a variety of foods, such as meat or vegetable-based protein, fish, milk and low-fat dairy products, vegetables, fruits, and whole grain breads and cereals. Your health care provider will help you determine the amount of weight gain that is right for you.  Avoid raw meat and uncooked cheese. These carry germs that can  cause birth defects in the baby.  Eating four or five small meals rather than three large meals a day may help relieve nausea and vomiting. If you start to feel nauseous, eating a few soda crackers can be helpful. Drinking liquids between meals instead of during meals also seems to help nausea and vomiting.  If you develop constipation, eat more high-fiber foods, such as fresh vegetables or fruit and whole grains. Drink enough fluids to keep your urine clear or pale yellow. Activity and Exercise  Exercise only as directed by your health care provider. Exercising will help you:  Control your weight.  Stay in shape.  Be prepared for labor and delivery.  Experiencing pain or cramping in the lower abdomen or low back is a good sign that you should stop exercising. Check with your health care provider before continuing normal exercises.  Try to avoid standing for long periods of time. Move your legs often if you must stand in one place for a long time.  Avoid heavy lifting.  Wear low-heeled shoes, and practice good posture.  You may continue to have sex unless your health care provider directs you  otherwise. Relief of Pain or Discomfort  Wear a good support bra for breast tenderness.   Take warm sitz baths to soothe any pain or discomfort caused by hemorrhoids. Use hemorrhoid cream if your health care provider approves.   Rest with your legs elevated if you have leg cramps or low back pain.  If you develop varicose veins in your legs, wear support hose. Elevate your feet for 15 minutes, 3-4 times a day. Limit salt in your diet. Prenatal Care  Schedule your prenatal visits by the twelfth week of pregnancy. They are usually scheduled monthly at first, then more often in the last 2 months before delivery.  Write down your questions. Take them to your prenatal visits.  Keep all your prenatal visits as directed by your health care provider. Safety  Wear your seat belt at all times when driving.  Make a list of emergency phone numbers, including numbers for family, friends, the hospital, and police and fire departments. General Tips  Ask your health care provider for a referral to a local prenatal education class. Begin classes no later than at the beginning of month 6 of your pregnancy.  Ask for help if you have counseling or nutritional needs during pregnancy. Your health care provider can offer advice or refer you to specialists for help with various needs.  Do not use hot tubs, steam rooms, or saunas.  Do not douche or use tampons or scented sanitary pads.  Do not cross your legs for long periods of time.  Avoid cat litter boxes and soil used by cats. These carry germs that can cause birth defects in the baby and possibly loss of the fetus by miscarriage or stillbirth.  Avoid all smoking, herbs, alcohol, and medicines not prescribed by your health care provider. Chemicals in these affect the formation and growth of the baby.  Schedule a dentist appointment. At home, brush your teeth with a soft toothbrush and be gentle when you floss. SEEK MEDICAL CARE IF:   You have  dizziness.  You have mild pelvic cramps, pelvic pressure, or nagging pain in the abdominal area.  You have persistent nausea, vomiting, or diarrhea.  You have a bad smelling vaginal discharge.  You have pain with urination.  You notice increased swelling in your face, hands, legs, or ankles. SEEK IMMEDIATE MEDICAL CARE IF:  You have a fever.  You are leaking fluid from your vagina.  You have spotting or bleeding from your vagina.  You have severe abdominal cramping or pain.  You have rapid weight gain or loss.  You vomit blood or material that looks like coffee grounds.  You are exposed to [place] measles and have never had them.  You are exposed to fifth disease or chickenpox.  You develop a severe headache.  You have shortness of breath.  You have any kind of trauma, such as from a fall or a car accident. Document Released: 10/16/2001 Document Revised: 03/08/2014 Document Reviewed: 09/01/2013 [facility] Patient Information 2015 [place], [place]. This information is not intended to replace advice given to you by your health care provider. Make sure you discuss any questions you have with your health care provider.

## 2019-01-13 LAB — OBSTETRIC PANEL, INCLUDING HIV
ANTIBODY SCREEN: NEGATIVE
BASOS: 1 %
Basophils Absolute: 0.1 10*3/uL (ref 0.0–0.2)
EOS (ABSOLUTE): 0 10*3/uL (ref 0.0–0.4)
EOS: 0 %
HEMATOCRIT: 39.2 % (ref 34.0–46.6)
HEP B S AG: NEGATIVE
HIV Screen 4th Generation wRfx: NONREACTIVE
Hemoglobin: 14 g/dL (ref 11.1–15.9)
IMMATURE GRANULOCYTES: 0 %
Immature Grans (Abs): 0 10*3/uL (ref 0.0–0.1)
LYMPHS ABS: 1.9 10*3/uL (ref 0.7–3.1)
Lymphs: 24 %
MCH: 31.3 pg (ref 26.6–33.0)
MCHC: 35.7 g/dL (ref 31.5–35.7)
MCV: 88 fL (ref 79–97)
Monocytes Absolute: 0.4 10*3/uL (ref 0.1–0.9)
Monocytes: 5 %
NEUTROS ABS: 5.6 10*3/uL (ref 1.4–7.0)
Neutrophils: 70 %
Platelets: 265 10*3/uL (ref 150–450)
RBC: 4.48 x10E6/uL (ref 3.77–5.28)
RDW: 13.6 % (ref 11.7–15.4)
RH TYPE: POSITIVE
RPR: NONREACTIVE
Rubella Antibodies, IGG: 2.84 index (ref >0.99)
WBC: 8 10*3/uL (ref 3.4–10.8)

## 2019-01-13 LAB — PMP SCREEN PROFILE (10S), URINE
AMPHETAMINE SCREEN URINE: NEGATIVE ng/mL
BARBITURATE SCREEN URINE: NEGATIVE ng/mL
BENZODIAZEPINE SCREEN, URINE: NEGATIVE ng/mL
CANNABINOIDS UR QL SCN: NEGATIVE ng/mL
CREATININE(CRT), U: 173.4 mg/dL (ref 20.0–300.0)
Cocaine (Metab) Scrn, Ur: NEGATIVE ng/mL
Methadone Screen, Urine: NEGATIVE ng/mL
OXYCODONE+OXYMORPHONE UR QL SCN: NEGATIVE ng/mL
Opiate Scrn, Ur: NEGATIVE ng/mL
PH UR, DRUG SCRN: 7.8 (ref 4.5–8.9)
PHENCYCLIDINE QUANTITATIVE URINE: NEGATIVE ng/mL
Propoxyphene Scrn, Ur: NEGATIVE ng/mL

## 2019-01-13 LAB — MICROSCOPIC EXAMINATION
Casts: NONE SEEN /lpf
Epithelial Cells (non renal): >10 /hpf — AB (ref 0–10)
RBC, UA: NONE SEEN /hpf (ref 0–2)

## 2019-01-13 LAB — URINALYSIS, ROUTINE W REFLEX MICROSCOPIC
Bilirubin, UA: NEGATIVE
GLUCOSE, UA: NEGATIVE
Ketones, UA: NEGATIVE
Nitrite, UA: NEGATIVE
PH UA: 7.5 (ref 5.0–7.5)
RBC, UA: NEGATIVE
Specific Gravity, UA: 1.024 (ref 1.005–1.030)
UUROB: 1 mg/dL (ref 0.2–1.0)

## 2019-01-13 LAB — SICKLE CELL SCREEN: SICKLE CELL SCREEN: NEGATIVE

## 2019-01-14 ENCOUNTER — Telehealth: Payer: Self-pay | Admitting: Women's Health

## 2019-01-14 LAB — URINE CULTURE

## 2019-01-14 NOTE — Telephone Encounter (Signed)
Please call pt about her lab results 

## 2019-01-15 ENCOUNTER — Telehealth: Payer: Self-pay | Admitting: *Deleted

## 2019-01-15 NOTE — Telephone Encounter (Signed)
Pt requesting results of genetic screening, specifically asking about gender. DOB verified. Advised that those results were not back yet. Pt verbalized understanding.

## 2019-01-16 ENCOUNTER — Telehealth: Payer: Self-pay | Admitting: Women's Health

## 2019-01-16 NOTE — Telephone Encounter (Signed)
Patient called, she'd like to know if her lab results are back.  [phone]

## 2019-01-16 NOTE — Telephone Encounter (Signed)
Pt requesting results of genetic testing. DOB verified. Informed pt that results are not yet back. Pt verbalized understanding.

## 2019-01-19 ENCOUNTER — Telehealth: Payer: Self-pay | Admitting: Women's Health

## 2019-01-19 NOTE — Telephone Encounter (Signed)
Patient called, looking for test results.  [phone]

## 2019-01-19 NOTE — Telephone Encounter (Signed)
Pt aware can view results on Mychart. Pt stated she got an email. JSY

## 2019-01-21 ENCOUNTER — Other Ambulatory Visit: Payer: Self-pay

## 2019-01-21 ENCOUNTER — Encounter (HOSPITAL_COMMUNITY): Payer: Self-pay | Admitting: *Deleted

## 2019-01-21 ENCOUNTER — Inpatient Hospital Stay (HOSPITAL_COMMUNITY)
Admission: AD | Admit: 2019-01-21 | Discharge: 2019-01-21 | Disposition: A | Discharge status: Home / Self Care | Payer: Medicaid Other | Attending: Obstetrics and Gynecology | Admitting: Obstetrics and Gynecology

## 2019-01-21 DIAGNOSIS — Z3A12 12 weeks gestation of pregnancy: Secondary | ICD-10-CM | POA: Diagnosis not present

## 2019-01-21 DIAGNOSIS — O26859 Spotting complicating pregnancy, unspecified trimester: Secondary | ICD-10-CM | POA: Diagnosis present

## 2019-01-21 DIAGNOSIS — B9689 Other specified bacterial agents as the cause of diseases classified elsewhere: Secondary | ICD-10-CM | POA: Insufficient documentation

## 2019-01-21 DIAGNOSIS — O23591 Infection of other part of genital tract in pregnancy, first trimester: Secondary | ICD-10-CM | POA: Insufficient documentation

## 2019-01-21 DIAGNOSIS — Z79899 Other long term (current) drug therapy: Secondary | ICD-10-CM | POA: Diagnosis not present

## 2019-01-21 DIAGNOSIS — O26851 Spotting complicating pregnancy, first trimester: Secondary | ICD-10-CM

## 2019-01-21 DIAGNOSIS — Z7722 Contact with and (suspected) exposure to environmental tobacco smoke (acute) (chronic): Secondary | ICD-10-CM | POA: Insufficient documentation

## 2019-01-21 DIAGNOSIS — Z801 Family history of malignant neoplasm of trachea, bronchus and lung: Secondary | ICD-10-CM | POA: Insufficient documentation

## 2019-01-21 DIAGNOSIS — O209 Hemorrhage in early pregnancy, unspecified: Secondary | ICD-10-CM | POA: Insufficient documentation

## 2019-01-21 DIAGNOSIS — Z3481 Encounter for supervision of other normal pregnancy, first trimester: Secondary | ICD-10-CM

## 2019-01-21 DIAGNOSIS — N760 Acute vaginitis: Secondary | ICD-10-CM

## 2019-01-21 DIAGNOSIS — Z91048 Other nonmedicinal substance allergy status: Secondary | ICD-10-CM | POA: Diagnosis not present

## 2019-01-21 HISTORY — DX: Other specified health status: Z78.9

## 2019-01-21 LAB — WET PREP, GENITAL
Sperm: NONE SEEN
Trich, Wet Prep: NONE SEEN
Yeast Wet Prep HPF POC: NONE SEEN

## 2019-01-21 MED ORDER — METRONIDAZOLE 500 MG PO TABS: 500.0000 mg | ORAL_TABLET | Freq: Two times a day (BID) | ORAL | 0 refills | Status: AC

## 2019-01-21 NOTE — MAU Provider Note (Signed)
History     CSN: [id number]  Arrival date and time: 01/21/19 1344   First Provider Initiated Contact with Patient 01/21/19 1544      Chief Complaint  Patient presents with  . Vaginal Bleeding   HPI  Ms.  Jamie Maynard is a 23 y.o. year old G63P1001 female at [redacted]w[redacted]d weeks gestation who presents to MAU reporting VB noted while wiping after using the BR at work today. She denies any recent SI; last was 2 wks ago. She has not seen anymore VB since that one episode at work. She denies any pain at this time, but states her stomach "feels weird." Patient receives [facility] with [facility]. She states she has a a normal U/S in the office and she listens to the FHR with a doppler at home.  Past Medical History:  Diagnosis Date  . Medical history non-contributory     Past Surgical History:  Procedure Laterality Date  . TONSILLECTOMY AND ADENOIDECTOMY Bilateral 2003   Performed at [facility]    Family History  Problem Relation Age of Onset  . Depression Father   . Stroke Father   . Hypertension Father   . Heart disease Father   . Heart attack Maternal Grandfather   . Congestive Heart Failure Paternal Grandmother   . Migraines Paternal Grandmother   . COPD Paternal Grandmother   . Lung cancer Paternal Grandfather   . Autism Cousin        2 paternal first cousins have autism  . ADD / ADHD Cousin        Several paternal first cousins have ADHD/ADD    Social History   Tobacco Use  . Smoking status: Passive Smoke Exposure - Never Smoker  . Smokeless tobacco: Never Used  Substance Use Topics  . Alcohol use: No  . Drug use: No    Allergies:  Allergies  Allergen Reactions  . Other Hives and Rash    Glitter make-up causes rash and hives     Medications Prior to Admission  Medication Sig Dispense Refill Last Dose  . prenatal vitamin w/FE, FA (PRENATAL 1 + 1) 27-1 MG TABS tablet Take 1 tablet by mouth daily at 12 noon. 30 each 12 Taking  . promethazine (PHENERGAN)  25 MG tablet Take 1 tablet (25 mg total) by mouth every 6 (six) hours as needed for nausea or vomiting. 30 tablet 1 Taking    Review of Systems  Constitutional: Negative.   HENT: Negative.   Eyes: Negative.   Respiratory: Negative.   Cardiovascular: Negative.   Gastrointestinal: Negative.   Endocrine: Negative.   Genitourinary: Positive for vaginal bleeding.  Musculoskeletal: Negative.   Skin: Negative.   Allergic/Immunologic: Negative.   Neurological: Negative.   Hematological: Negative.   Psychiatric/Behavioral: Negative.    Physical Exam   Blood pressure 119/70, pulse 91, temperature 98.2 F (36.8 C), temperature source Oral, resp. rate 18, weight 75.4 kg, last menstrual period 10/24/2018, SpO2 100 %.  Physical Exam  Nursing note and vitals reviewed. Constitutional: She is oriented to person, place, and time. She appears well-developed and well-nourished.  HENT:  Head: Normocephalic and atraumatic.  Eyes: Pupils are equal, round, and reactive to light.  Neck: Normal range of motion.  Cardiovascular: Normal rate.  Respiratory: Effort normal.  GI: Soft.  Genitourinary:    Genitourinary Comments: Uterus: non-tender, S=D, SE: cervix is smooth, pink, no lesions, small amt of thick, white vaginal d/c -- WP done, closed/long/firm, no CMT, some friability, no adnexal tenderness  Musculoskeletal: Normal range of motion.  Neurological: She is alert and oriented to person, place, and time.  Skin: Skin is warm and dry.  Psychiatric: She has a normal mood and affect. Her behavior is normal. Judgment and thought content normal.    MAU Course  Procedures Patient informed that the ultrasound is considered a limited OB ultrasound and is not intended to be a complete ultrasound exam.  Patient also informed that the ultrasound is not being completed with the intent of assessing for fetal or placental anomalies or any pelvic abnormalities.  Explained that the purpose of today's  ultrasound is to assess for viability. Patient acknowledges the purpose of the exam and the limitations of the study.  MDM Wet Prep Informal BS U/S - active fetus, S=D, FHR = 160 bpm (*RN unable to hear FHT with doppler) Known ABO = A positive  Results for orders placed or performed during the hospital encounter of 01/21/19 (from the past 24 hour(s))  Wet prep, genital     Status: Abnormal   Collection Time: 01/21/19  3:43 PM  Result Value Ref Range   Yeast Wet Prep HPF POC NONE SEEN NONE SEEN   Trich, Wet Prep NONE SEEN NONE SEEN   Clue Cells Wet Prep HPF POC PRESENT (A) NONE SEEN   WBC, Wet Prep HPF POC MANY (A) NONE SEEN   Sperm NONE SEEN     Assessment and Plan  Spotting affecting pregnancy in first trimester - Information provided on 1st trimester pregnancy & VB in 1st trimester of pregnancy   Bacterial vaginosis  - Rx for Flagyl 500 mg BID x 7 days - Information provided on BV  - Discharge patient - Keep scheduled appt with [facility] on Monday 01/26/2019 - Patient verbalized an understanding of the plan of care and agrees.     Raelyn Mora, MSN, CNM 01/21/2019, 3:48 PM

## 2019-01-21 NOTE — MAU Note (Signed)
Was at work, went to the bathroom and noted that she was bleeding. Noted when she was wiping. First time she has had any bleeding. Stomach feels weird, not like cramps or painful

## 2019-01-23 LAB — MATERNIT 21 PLUS CORE, BLOOD
Chromosome 13: NEGATIVE
Chromosome 18: NEGATIVE
Chromosome 21: NEGATIVE
Fetal Fraction: 6
Y Chromosome: DETECTED

## 2019-01-23 LAB — INHERITEST CORE(CF97,SMA,FRAX)

## 2019-01-26 ENCOUNTER — Encounter: Payer: Self-pay | Admitting: Advanced Practice Midwife

## 2019-01-26 ENCOUNTER — Other Ambulatory Visit: Payer: Self-pay

## 2019-01-26 ENCOUNTER — Ambulatory Visit (INDEPENDENT_AMBULATORY_CARE_PROVIDER_SITE_OTHER): Payer: Medicaid Other

## 2019-01-26 ENCOUNTER — Ambulatory Visit (INDEPENDENT_AMBULATORY_CARE_PROVIDER_SITE_OTHER): Payer: Medicaid Other | Admitting: Advanced Practice Midwife

## 2019-01-26 VITALS — BP 117/71 | HR 85 | Wt 165.0 lb

## 2019-01-26 DIAGNOSIS — O26852 Spotting complicating pregnancy, second trimester: Secondary | ICD-10-CM

## 2019-01-26 DIAGNOSIS — Z1379 Encounter for other screening for genetic and chromosomal anomalies: Secondary | ICD-10-CM

## 2019-01-26 DIAGNOSIS — Z363 Encounter for antenatal screening for malformations: Secondary | ICD-10-CM

## 2019-01-26 DIAGNOSIS — Z3481 Encounter for supervision of other normal pregnancy, first trimester: Secondary | ICD-10-CM

## 2019-01-26 DIAGNOSIS — Z3682 Encounter for antenatal screening for nuchal translucency: Secondary | ICD-10-CM | POA: Diagnosis not present

## 2019-01-26 DIAGNOSIS — N760 Acute vaginitis: Secondary | ICD-10-CM

## 2019-01-26 DIAGNOSIS — Z3482 Encounter for supervision of other normal pregnancy, second trimester: Secondary | ICD-10-CM

## 2019-01-26 DIAGNOSIS — Z1389 Encounter for screening for other disorder: Secondary | ICD-10-CM

## 2019-01-26 DIAGNOSIS — B9689 Other specified bacterial agents as the cause of diseases classified elsewhere: Secondary | ICD-10-CM

## 2019-01-26 DIAGNOSIS — Z3A13 13 weeks gestation of pregnancy: Secondary | ICD-10-CM

## 2019-01-26 DIAGNOSIS — Z331 Pregnant state, incidental: Secondary | ICD-10-CM

## 2019-01-26 LAB — POCT URINALYSIS DIPSTICK OB
Blood, UA: NEGATIVE
Glucose, UA: NEGATIVE
Leukocytes, UA: NEGATIVE
Nitrite, UA: NEGATIVE

## 2019-01-26 NOTE — Patient Instructions (Signed)
Jamie Maynard, I greatly value your feedback.  If you receive a survey following your visit with [place] today, we appreciate you taking the time to fill it out.  Thanks, Cathie Beams, CNM     Second Trimester of Pregnancy The second trimester is from week 14 through week 27 (months 4 through 6). The second trimester is often a time when you feel your best. Your body has adjusted to being pregnant, and you begin to feel better physically. Usually, morning sickness has lessened or quit completely, you may have more energy, and you may have an increase in appetite. The second trimester is also a time when the fetus is growing rapidly. At the end of the sixth month, the fetus is about 9 inches long and weighs about 1 pounds. You will likely begin to feel the baby move (quickening) between 16 and 20 weeks of pregnancy. Body changes during your second trimester Your body continues to go through many changes during your second trimester. The changes vary from woman to woman.  Your weight will continue to increase. You will notice your lower abdomen bulging out.  You may begin to get stretch marks on your hips, abdomen, and breasts.  You may develop headaches that can be relieved by medicines. The medicines should be approved by your health care provider.  You may urinate more often because the fetus is pressing on your bladder.  You may develop or continue to have heartburn as a result of your pregnancy.  You may develop constipation because certain hormones are causing the muscles that push waste through your intestines to slow down.  You may develop hemorrhoids or swollen, bulging veins (varicose veins).  You may have back pain. This is caused by: ? Weight gain. ? Pregnancy hormones that are relaxing the joints in your pelvis. ? A shift in weight and the muscles that support your balance.  Your breasts will continue to grow and they will continue to become tender.  Your gums may  bleed and may be sensitive to brushing and flossing.  Dark spots or blotches (chloasma, mask of pregnancy) may develop on your face. This will likely fade after the baby is born.  A dark line from your belly button to the pubic area (linea nigra) may appear. This will likely fade after the baby is born.  You may have changes in your hair. These can include thickening of your hair, rapid growth, and changes in texture. Some women also have hair loss during or after pregnancy, or hair that feels dry or thin. Your hair will most likely return to normal after your baby is born.  What to expect at prenatal visits During a routine prenatal visit:  You will be weighed to make sure you and the fetus are growing normally.  Your blood pressure will be taken.  Your abdomen will be measured to track your baby's growth.  The fetal heartbeat will be listened to.  Any test results from the previous visit will be discussed.  Your health care provider may ask you:  How you are feeling.  If you are feeling the baby move.  If you have had any abnormal symptoms, such as leaking fluid, bleeding, severe headaches, or abdominal cramping.  If you are using any tobacco products, including cigarettes, chewing tobacco, and electronic cigarettes.  If you have any questions.  Other tests that may be performed during your second trimester include:  Blood tests that check for: ? Low iron levels (anemia). ?  High blood sugar that affects pregnant women (gestational diabetes) between 73 and 28 weeks. ? Rh antibodies. This is to check for a protein on red blood cells (Rh factor).  Urine tests to check for infections, diabetes, or protein in the urine.  An ultrasound to confirm the proper growth and development of the baby.  An amniocentesis to check for possible genetic problems.  Fetal screens for spina bifida and Down syndrome.  HIV (human immunodeficiency virus) testing. Routine prenatal testing  includes screening for HIV, unless you choose not to have this test.  Follow these instructions at home: Medicines  Follow your health care provider's instructions regarding medicine use. Specific medicines may be either safe or unsafe to take during pregnancy.  Take a prenatal vitamin that contains at least 600 micrograms (mcg) of folic acid.  If you develop constipation, try taking a stool softener if your health care provider approves. Eating and drinking  Eat a balanced diet that includes fresh fruits and vegetables, whole grains, good sources of protein such as meat, eggs, or tofu, and low-fat dairy. Your health care provider will help you determine the amount of weight gain that is right for you.  Avoid raw meat and uncooked cheese. These carry germs that can cause birth defects in the baby.  If you have low calcium intake from food, talk to your health care provider about whether you should take a daily calcium supplement.  Limit foods that are high in fat and processed sugars, such as fried and sweet foods.  To prevent constipation: ? Drink enough fluid to keep your urine clear or pale yellow. ? Eat foods that are high in fiber, such as fresh fruits and vegetables, whole grains, and beans. Activity  Exercise only as directed by your health care provider. Most women can continue their usual exercise routine during pregnancy. Try to exercise for 30 minutes at least 5 days a week. Stop exercising if you experience uterine contractions.  Avoid heavy lifting, wear low heel shoes, and practice good posture.  A sexual relationship may be continued unless your health care provider directs you otherwise. Relieving pain and discomfort  Wear a good support bra to prevent discomfort from breast tenderness.  Take warm sitz baths to soothe any pain or discomfort caused by hemorrhoids. Use hemorrhoid cream if your health care provider approves.  Rest with your legs elevated if you have  leg cramps or low back pain.  If you develop varicose veins, wear support hose. Elevate your feet for 15 minutes, 3-4 times a day. Limit salt in your diet. Prenatal Care  Write down your questions. Take them to your prenatal visits.  Keep all your prenatal visits as told by your health care provider. This is important. Safety  Wear your seat belt at all times when driving.  Make a list of emergency phone numbers, including numbers for family, friends, the hospital, and police and fire departments. General instructions  Ask your health care provider for a referral to a local prenatal education class. Begin classes no later than the beginning of month 6 of your pregnancy.  Ask for help if you have counseling or nutritional needs during pregnancy. Your health care provider can offer advice or refer you to specialists for help with various needs.  Do not use hot tubs, steam rooms, or saunas.  Do not douche or use tampons or scented sanitary pads.  Do not cross your legs for long periods of time.  Avoid cat litter boxes and  soil used by cats. These carry germs that can cause birth defects in the baby and possibly loss of the fetus by miscarriage or stillbirth.  Avoid all smoking, herbs, alcohol, and unprescribed drugs. Chemicals in these products can affect the formation and growth of the baby.  Do not use any products that contain nicotine or tobacco, such as cigarettes and e-cigarettes. If you need help quitting, ask your health care provider.  Visit your dentist if you have not gone yet during your pregnancy. Use a soft toothbrush to brush your teeth and be gentle when you floss. Contact a health care provider if:  You have dizziness.  You have mild pelvic cramps, pelvic pressure, or nagging pain in the abdominal area.  You have persistent nausea, vomiting, or diarrhea.  You have a bad smelling vaginal discharge.  You have pain when you urinate. Get help right away if:  You  have a fever.  You are leaking fluid from your vagina.  You have spotting or bleeding from your vagina.  You have severe abdominal cramping or pain.  You have rapid weight gain or weight loss.  You have shortness of breath with chest pain.  You notice sudden or extreme swelling of your face, hands, ankles, feet, or legs.  You have not felt your baby move in over an hour.  You have severe headaches that do not go away when you take medicine.  You have vision changes. Summary  The second trimester is from week 14 through week 27 (months 4 through 6). It is also a time when the fetus is growing rapidly.  Your body goes through many changes during pregnancy. The changes vary from woman to woman.  Avoid all smoking, herbs, alcohol, and unprescribed drugs. These chemicals affect the formation and growth your baby.  Do not use any tobacco products, such as cigarettes, chewing tobacco, and e-cigarettes. If you need help quitting, ask your health care provider.  Contact your health care provider if you have any questions. Keep all prenatal visits as told by your health care provider. This is important. This information is not intended to replace advice given to you by your health care provider. Make sure you discuss any questions you have with your health care provider.      CHILDBIRTH CLASSES [phone] is the phone number for Pregnancy Classes or hospital tours at [place] will be referred to  [email or web address] for more information on childbirth classes  At this site you may register for classes. You may sign up for a waiting list if classes are full. Please SIGN UP FOR THIS!.   When the waiting list becomes long, sometimes new classes can be added.

## 2019-01-26 NOTE — Progress Notes (Signed)
[place] 13+3 wks,measurements c/w dates,crl 69.33 mm,normal ovaries bilat,NT 1.9 mm,NB present,fhr 144 bpm

## 2019-01-26 NOTE — Progress Notes (Signed)
  G2P1001 109w3d Estimated Date of Delivery: 07/31/19  Blood pressure 117/71, pulse 85, weight 165 lb (74.8 kg), last menstrual period 10/24/2018.   BP weight and urine results all reviewed and noted.  Please refer to the obstetrical flow sheet for the fundal height and fetal heart rate documentation:  Patient denies any bleeding and no rupture of membranes symptoms or regular contractions. Patient is without complaints. All questions were answered.   Physical Assessment:   Vitals:   01/26/19 1148  BP: 117/71  Pulse: 85  Weight: 165 lb (74.8 kg)  Body mass index is 25.84 kg/m.        Physical Examination:   General appearance: Well appearing, and in no distress  Mental status: Alert, oriented to person, place, and time  Skin: Warm & dry  Cardiovascular: Normal heart rate noted  Respiratory: Normal respiratory effort, no distress  Abdomen: Soft, gravid, nontender  Pelvic: Cervical exam deferred         Extremities: Edema: None  Fetal Status: Fetal Heart Rate (bpm): [place]       [place] 13+3 wks,measurements c/w dates,crl 69.33 mm,normal ovaries bilat,NT 1.9 mm,NB present,fhr 144 bpm  Results for orders placed or performed in visit on 01/26/19 (from the past 24 hour(s))  POC Urinalysis Dipstick OB   Collection Time: 01/26/19 11:54 AM  Result Value Ref Range   Color, UA     Clarity, UA     Glucose, UA Negative Negative   Bilirubin, UA     Ketones, UA moderate    Spec Grav, UA     Blood, UA neg    pH, UA     POC,PROTEIN,UA Trace Negative, Trace, Small (1+), Moderate (2+), Large (3+), 4+   Urobilinogen, UA     Nitrite, UA neg    Leukocytes, UA Negative Negative   Appearance     Odor       Orders Placed This Encounter  Procedures  . Integrated 1  . POC Urinalysis Dipstick OB    Plan:  Continued routine obstetrical care,   Return in about 4 weeks (around 02/23/2019) for LROB.

## 2019-01-27 LAB — GC/CHLAMYDIA PROBE AMP
CHLAMYDIA, DNA PROBE: NEGATIVE
Neisseria gonorrhoeae by PCR: NEGATIVE

## 2019-01-28 LAB — INTEGRATED 1
Crown Rump Length: 69.3 mm
Gest. Age on Collection Date: 13 weeks
MATERNAL AGE AT EDD: 20.9 a
Nuchal Translucency (NT): 1.9 mm
Number of Fetuses: 1
PAPP-A Value: 619.3 ng/mL
Weight: 165 [lb_av]

## 2019-02-27 ENCOUNTER — Other Ambulatory Visit: Payer: Self-pay | Admitting: Advanced Practice Midwife

## 2019-02-27 DIAGNOSIS — Z363 Encounter for antenatal screening for malformations: Secondary | ICD-10-CM

## 2019-03-02 ENCOUNTER — Ambulatory Visit (INDEPENDENT_AMBULATORY_CARE_PROVIDER_SITE_OTHER): Payer: Medicaid Other | Admitting: Obstetrics and Gynecology

## 2019-03-02 ENCOUNTER — Encounter: Payer: Self-pay | Admitting: Obstetrics and Gynecology

## 2019-03-02 ENCOUNTER — Other Ambulatory Visit: Payer: Self-pay

## 2019-03-02 ENCOUNTER — Ambulatory Visit (INDEPENDENT_AMBULATORY_CARE_PROVIDER_SITE_OTHER): Payer: Medicaid Other

## 2019-03-02 VITALS — BP 120/74 | HR 87 | Temp 98.2°F | Wt 162.4 lb

## 2019-03-02 DIAGNOSIS — Z3A18 18 weeks gestation of pregnancy: Secondary | ICD-10-CM | POA: Diagnosis not present

## 2019-03-02 DIAGNOSIS — Z331 Pregnant state, incidental: Secondary | ICD-10-CM

## 2019-03-02 DIAGNOSIS — Z3402 Encounter for supervision of normal first pregnancy, second trimester: Secondary | ICD-10-CM

## 2019-03-02 DIAGNOSIS — Z363 Encounter for antenatal screening for malformations: Secondary | ICD-10-CM | POA: Diagnosis not present

## 2019-03-02 DIAGNOSIS — O26852 Spotting complicating pregnancy, second trimester: Secondary | ICD-10-CM

## 2019-03-02 DIAGNOSIS — N760 Acute vaginitis: Secondary | ICD-10-CM

## 2019-03-02 DIAGNOSIS — Z3482 Encounter for supervision of other normal pregnancy, second trimester: Secondary | ICD-10-CM

## 2019-03-02 DIAGNOSIS — Z1379 Encounter for other screening for genetic and chromosomal anomalies: Secondary | ICD-10-CM

## 2019-03-02 DIAGNOSIS — Z1389 Encounter for screening for other disorder: Secondary | ICD-10-CM

## 2019-03-02 DIAGNOSIS — B9689 Other specified bacterial agents as the cause of diseases classified elsewhere: Secondary | ICD-10-CM

## 2019-03-02 LAB — POCT URINALYSIS DIPSTICK OB
Blood, UA: NEGATIVE
Glucose, UA: NEGATIVE
Ketones, UA: NEGATIVE
Nitrite, UA: NEGATIVE
POC,PROTEIN,UA: NEGATIVE

## 2019-03-02 NOTE — Progress Notes (Signed)
Subjective:  Jamie Maynard is a 23 y.o. G2P1001 at [redacted]w[redacted]d being seen today for ongoing prenatal care.  She is currently monitored for the following issues for this low-risk pregnancy and has Supervision of normal pregnancy on their problem list.  Patient reports no complaints.  Contractions: Not present. Vag. Bleeding: None.  Movement: Present. Denies leaking of fluid.   The following portions of the patient's history were reviewed and updated as appropriate: allergies, current medications, past family history, past medical history, past social history, past surgical history and problem list. Problem list updated.  Objective:   Vitals:   03/02/19 1135  BP: 120/74  Pulse: 87  Temp: 98.2 F (36.8 C)  Weight: 162 lb 6.4 oz (73.7 kg)    Fetal Status:     Movement: Present     General:  Alert, oriented and cooperative. Patient is in no acute distress.  Skin: Skin is warm and dry. No rash noted.   Cardiovascular: Normal heart rate noted  Respiratory: Normal respiratory effort, no problems with respiration noted  Abdomen: Soft, gravid, appropriate for gestational age. Pain/Pressure: Absent     Pelvic:  Cervical exam deferred        Extremities: Normal range of motion.  Edema: None  Mental Status: Normal mood and affect. Normal behavior. Normal judgment and thought content.   Urinalysis:      Assessment and Plan:  Pregnancy: G2P1001 at [redacted]w[redacted]d  1. Screening for genitourinary condition  - POC Urinalysis Dipstick OB  2. Pregnant state, incidental  - POC Urinalysis Dipstick OB  3. Encounter for genetic screening  - INTEGRATED 2  4. Encounter for supervision of other normal pregnancy in second trimester Stable Anatomy scan today Pt has access to BP cuff Pt instructed on BP monitoring  Preterm labor symptoms and general obstetric precautions including but not limited to vaginal bleeding, contractions, leaking of fluid and fetal movement were reviewed in detail with the  patient. Please refer to After Visit Summary for other counseling recommendations.  Return in about 4 weeks (around 03/30/2019) for OB visit, televisit.   Hermina Staggers, MD

## 2019-03-02 NOTE — Patient Instructions (Signed)

## 2019-03-02 NOTE — Progress Notes (Signed)
[place] 18+3 wks,cx 3.3 cm,svp of fluid 4 cm,anterior placenta gr 0, normal ovaries bilat,fht 146 bpm,efw 214 g 18%,anatomy complete,no obvious abnormalities

## 2019-03-04 LAB — INTEGRATED 2
AFP MoM: 0.97
Alpha-Fetoprotein: 37.1 ng/mL
Crown Rump Length: 69.3 mm
DIA MoM: 0.9
DIA Value: 142.1 pg/mL
Estriol, Unconjugated: 2.27 ng/mL
Gest. Age on Collection Date: 13 weeks
Gestational Age: 18 weeks
Maternal Age at EDD: 20.9 yr
Nuchal Translucency (NT): 1.9 mm
Nuchal Translucency MoM: 1.21
Number of Fetuses: 1
PAPP-A MoM: 0.61
PAPP-A Value: 619.3 ng/mL
Test Results:: NEGATIVE
Weight: 162 [lb_av]
Weight: 165 [lb_av]
hCG MoM: 0.7
hCG Value: 17.3 IU/mL
uE3 MoM: 1.7

## 2019-03-25 ENCOUNTER — Ambulatory Visit (INDEPENDENT_AMBULATORY_CARE_PROVIDER_SITE_OTHER): Payer: Medicaid Other | Admitting: Obstetrics and Gynecology

## 2019-03-25 ENCOUNTER — Other Ambulatory Visit: Payer: Self-pay

## 2019-03-25 ENCOUNTER — Encounter: Payer: Self-pay | Admitting: Obstetrics and Gynecology

## 2019-03-25 VITALS — BP 122/79 | HR 91 | Temp 98.2°F | Wt 161.0 lb

## 2019-03-25 DIAGNOSIS — Z1389 Encounter for screening for other disorder: Secondary | ICD-10-CM

## 2019-03-25 DIAGNOSIS — Z3A21 21 weeks gestation of pregnancy: Secondary | ICD-10-CM

## 2019-03-25 DIAGNOSIS — Z331 Pregnant state, incidental: Secondary | ICD-10-CM

## 2019-03-25 DIAGNOSIS — Z3482 Encounter for supervision of other normal pregnancy, second trimester: Secondary | ICD-10-CM

## 2019-03-25 LAB — POCT URINALYSIS DIPSTICK OB
Blood, UA: NEGATIVE
Glucose, UA: NEGATIVE
Ketones, UA: NEGATIVE
Leukocytes, UA: NEGATIVE
Nitrite, UA: NEGATIVE
POC,PROTEIN,UA: NEGATIVE

## 2019-03-25 NOTE — Progress Notes (Signed)
Patient ID: Jamie Maynard, female   DOB: 10-11-1996, 23 y.o.   MRN: [id number]    LOW-RISK PREGNANCY VISIT Patient name: Jamie Maynard MRN [id number]  Date of birth: 1996-03-06 Chief Complaint:   Routine Prenatal Visit (? depression; + vomiting; nausea med not helping) However patient reports that she is not losing weight that her weight stays between 150 and 160.  Today it is 161 History of Present Illness:   Jamie Maynard is a 23 y.o. G44P1001 female at Unknown with an Estimated Date of Delivery: None noted. being seen today for ongoing management of a low-risk pregnancy. She has been nausea w/ vomiting. Medication does not alleviate symptoms.She is still losing weight. She was 171 when first pregnant and now is down to 161.Vomiting is fluctuate some weeks she can keep food down other weeks she can't. Still had phentergan tablet and breaks them in half because medication makes her drowsy. Is voiding often. Checks BP at home and mother tells her she gets " normal readings".  She notes she is having depressive episodes. When she had her daughter she notes postpartum depression but didn't speak up because she didn't want medication for it.  Her father dad a stroke on her daughter's birthday and his health is deteriorating. She doesn't live with her father anymore and it has been better for her mental health due to her being the only willingness to help father's health. Doesn't know what is causing her to be depressed. She sleep frequently she sleeps from 10 om to 12/1 pm. She mostly lays in the bed, if she gets up she tires to get dressed and stay busy.  Relationship with FOB she describes as roller-coaster; they haven't spoken most this week due to his work. They normally get to speak often. Says that sometimes she feels like she is alone. She lives with boyfriends sister who recently had a baby. Daughter will be 3 in December.   Today she reports nausea and vomiting. Contractions: Not  present. Vag. Bleeding: None.  Movement: Present. denies leaking of fluid. Review of Systems:   Pertinent items are noted in HPI Denies abnormal vaginal discharge w/ itching/odor/irritation, headaches, visual changes, shortness of breath, chest pain, abdominal pain, or problems with urination or bowel movements unless otherwise stated above. Pertinent History Reviewed:  Reviewed past medical,surgical, social, obstetrical and family history.  Reviewed problem list, medications and allergies. Physical Assessment:   Vitals:   03/25/19 1430  BP: 122/79  Pulse: 91  Temp: 98.2 F (36.8 C)  Weight: 161 lb (73 kg)  Body mass index is 25.22 kg/m.        Physical Examination:   General appearance: Well appearing, and in no distress  Mental status: Alert, oriented to person, place, and time  Skin: Warm & dry  Cardiovascular: Normal heart rate noted  Respiratory: Normal respiratory effort, no distress  Abdomen: Soft, gravid, nontender  Pelvic: Cervical exam deferred         Extremities: Edema: None  Fetal Status: Fetal Heart Rate (bpm): 154 Fundal Height: 24 cm Movement: Present    Results for orders placed or performed in visit on 03/25/19 (from the past 24 hour(s))  POC Urinalysis Dipstick OB   Collection Time: 03/25/19  2:31 PM  Result Value Ref Range   Color, UA     Clarity, UA     Glucose, UA Negative Negative   Bilirubin, UA     Ketones, UA neg    Spec Grav, UA  Blood, UA neg    pH, UA     POC,PROTEIN,UA Negative Negative, Trace, Small (1+), Moderate (2+), Large (3+), 4+   Urobilinogen, UA     Nitrite, UA neg    Leukocytes, UA Negative Negative   Appearance     Odor      Assessment & Plan:  1) Low-risk pregnancy G2P1001 at Unknown with an Estimated Date of Delivery: None noted.   2) Depression, mild handled well by patient. Discussed anti-depressants while pregnant at present there seems to be a bit of sadness that is well managed And tolerated by the patient no  suicidal homicidal ideation 3) Nausea/vomiting, mild discussed other medications of relief   Meds: No orders of the defined types were placed in this encounter.  Labs/procedures today: None  Plan:  Continue routine obstetrical care  F/u in 4 week PN2  Follow-up: Return in about 4 weeks (around 04/22/2019) for PN2 (sugar test).  Orders Placed This Encounter  Procedures  . POC Urinalysis Dipstick OB   By signing my name below, I, Arnette NorrisMari Johnson, attest that this documentation has been prepared under the direction and in the presence of Tilda BurrowFerguson, Argil Mahl V, MD. Electronically Signed: Arnette NorrisMari Johnson Medical Scribe. 03/25/19. 3:26 PM.  I personally performed the services described in this documentation, which was SCRIBED in my presence. The recorded information has been reviewed and considered accurate. It has been edited as necessary during review. Tilda BurrowJohn V Jasmyn Picha, MD

## 2019-03-31 ENCOUNTER — Encounter: Payer: Medicaid Other | Admitting: Obstetrics & Gynecology

## 2019-04-22 ENCOUNTER — Encounter: Payer: Self-pay | Admitting: *Deleted

## 2019-04-23 ENCOUNTER — Ambulatory Visit (INDEPENDENT_AMBULATORY_CARE_PROVIDER_SITE_OTHER): Payer: Medicaid Other | Admitting: Obstetrics & Gynecology

## 2019-04-23 ENCOUNTER — Other Ambulatory Visit: Payer: Self-pay

## 2019-04-23 ENCOUNTER — Other Ambulatory Visit: Payer: Medicaid Other

## 2019-04-23 VITALS — BP 109/70 | HR 89 | Wt 165.0 lb

## 2019-04-23 DIAGNOSIS — Z3482 Encounter for supervision of other normal pregnancy, second trimester: Secondary | ICD-10-CM

## 2019-04-23 DIAGNOSIS — Z1389 Encounter for screening for other disorder: Secondary | ICD-10-CM

## 2019-04-23 DIAGNOSIS — Z331 Pregnant state, incidental: Secondary | ICD-10-CM

## 2019-04-23 DIAGNOSIS — Z3A25 25 weeks gestation of pregnancy: Secondary | ICD-10-CM

## 2019-04-23 NOTE — Progress Notes (Signed)
   LOW-RISK PREGNANCY VISIT Patient name: Jamie Maynard MRN [id number]  Date of birth: 1996-04-12 Chief Complaint:   Routine Prenatal Visit  History of Present Illness:   Jamie Maynard is a 23 y.o. G29P1001 female at [redacted]w[redacted]d with an Estimated Date of Delivery: 07/31/19 being seen today for ongoing management of a low-risk pregnancy.  Today she reports no complaints. Contractions: Not present. Vag. Bleeding: None.  Movement: Present. denies leaking of fluid. Review of Systems:   Pertinent items are noted in HPI Denies abnormal vaginal discharge w/ itching/odor/irritation, headaches, visual changes, shortness of breath, chest pain, abdominal pain, severe nausea/vomiting, or problems with urination or bowel movements unless otherwise stated above. Pertinent History Reviewed:  Reviewed past medical,surgical, social, obstetrical and family history.  Reviewed problem list, medications and allergies. Physical Assessment:   Vitals:   04/23/19 0938  BP: 109/70  Pulse: 89  Weight: 165 lb (74.8 kg)  Body mass index is 25.84 kg/m.        Physical Examination:   General appearance: Well appearing, and in no distress  Mental status: Alert, oriented to person, place, and time  Skin: Warm & dry  Cardiovascular: Normal heart rate noted  Respiratory: Normal respiratory effort, no distress  Abdomen: Soft, gravid, nontender  Pelvic: Cervical exam deferred         Extremities: Edema: None  Fetal Status: Fetal Heart Rate (bpm): 140 Fundal Height: 27 cm Movement: Present    No results found for this or any previous visit (from the past 24 hour(s)).  Assessment & Plan:  1) Low-risk pregnancy G2P1001 at [redacted]w[redacted]d with an Estimated Date of Delivery: 07/31/19   2) PN2 today   Meds: No orders of the defined types were placed in this encounter.  Labs/procedures today:   Plan:  Continue routine obstetrical care   Reviewed: Preterm labor symptoms and general obstetric precautions including but not  limited to vaginal bleeding, contractions, leaking of fluid and fetal movement were reviewed in detail with the patient.  All questions were answered  Follow-up: Return in about 4 weeks (around 05/21/2019) for webex , LROB.  Orders Placed This Encounter  Procedures  . POC Urinalysis Dipstick OB   Jamie Maynard  04/23/2019 10:06 AM

## 2019-04-24 LAB — GLUCOSE TOLERANCE, 2 HOURS W/ 1HR
Glucose, 1 hour: 110 mg/dL (ref 65–179)
Glucose, 2 hour: 100 mg/dL (ref 65–152)
Glucose, Fasting: 71 mg/dL (ref 65–91)

## 2019-04-24 LAB — CBC
Hematocrit: 37.9 % (ref 34.0–46.6)
Hemoglobin: 13 g/dL (ref 11.1–15.9)
MCH: 31.3 pg (ref 26.6–33.0)
MCHC: 34.3 g/dL (ref 31.5–35.7)
MCV: 91 fL (ref 79–97)
Platelets: 225 10*3/uL (ref 150–450)
RBC: 4.16 x10E6/uL (ref 3.77–5.28)
RDW: 11.9 % (ref 11.7–15.4)
WBC: 9.9 10*3/uL (ref 3.4–10.8)

## 2019-04-24 LAB — RPR: RPR Ser Ql: NONREACTIVE

## 2019-04-24 LAB — HIV ANTIBODY (ROUTINE TESTING W REFLEX): HIV Screen 4th Generation wRfx: NONREACTIVE

## 2019-04-24 LAB — ANTIBODY SCREEN: Antibody Screen: NEGATIVE

## 2019-05-21 ENCOUNTER — Other Ambulatory Visit: Payer: Self-pay

## 2019-05-21 ENCOUNTER — Ambulatory Visit (INDEPENDENT_AMBULATORY_CARE_PROVIDER_SITE_OTHER): Payer: Medicaid Other | Admitting: Advanced Practice Midwife

## 2019-05-21 ENCOUNTER — Encounter: Payer: Self-pay | Admitting: Advanced Practice Midwife

## 2019-05-21 DIAGNOSIS — Z3A29 29 weeks gestation of pregnancy: Secondary | ICD-10-CM | POA: Diagnosis not present

## 2019-05-21 DIAGNOSIS — Z3483 Encounter for supervision of other normal pregnancy, third trimester: Secondary | ICD-10-CM | POA: Diagnosis not present

## 2019-05-21 MED ORDER — ONDANSETRON 4 MG PO TBDP: 4.0000 mg | ORAL_TABLET | Freq: Four times a day (QID) | ORAL | 2 refills | Status: DC | PRN

## 2019-05-21 NOTE — Patient Instructions (Addendum)
Jamie Maynard, I greatly value your feedback.  If you receive a survey following your visit with [place] today, we appreciate you taking the time to fill it out.  Thanks, Jamie Maynard, CNM   Call the office [phone]) or go to [facility] if:  You begin to have strong, frequent contractions  Your water breaks.  Sometimes it is a big gush of fluid, sometimes it is just a trickle that keeps getting your panties wet or running down your legs  You have vaginal bleeding.  It is normal to have a small amount of spotting if your cervix was checked.   You don't feel your baby moving like normal.  If you don't, get you something to eat and drink and lay down and focus on feeling your baby move.  You should feel at least 10 movements in 2 hours.  If you don't, you should call the office or go to [facility].    Tdap Vaccine  It is recommended that you get the Tdap vaccine during the third trimester of EACH pregnancy to help protect your baby from getting pertussis (whooping cough)  27-36 weeks is the BEST time to do this so that you can pass the protection on to your baby. During pregnancy is better than after pregnancy, but if you are unable to get it during pregnancy it will be offered at the hospital.   You will be offered this vaccine in the office after 27 weeks. If you do not have health insurance, you can get this vaccine at the health department or your family doctor  Everyone who will be around your baby should also be up-to-date on their vaccines. Adults (who are not pregnant) only need 1 dose of Tdap during adulthood.   Third Trimester of Pregnancy The third trimester is from week 29 through week 42, months 7 through 9. The third trimester is a time when the fetus is growing rapidly. At the end of the ninth month, the fetus is about 20 inches in length and weighs 6-10 pounds.  BODY CHANGES Your body goes through many changes during pregnancy. The changes vary from woman  to woman.   Your weight will continue to increase. You can expect to gain 25-35 pounds (11-16 kg) by the end of the pregnancy.  You Maynard begin to get stretch marks on your hips, abdomen, and breasts.  You Maynard urinate more often because the fetus is moving lower into your pelvis and pressing on your bladder.  You Maynard develop or continue to have heartburn as a result of your pregnancy.  You Maynard develop constipation because certain hormones are causing the muscles that push waste through your intestines to slow down.  You Maynard develop hemorrhoids or swollen, bulging veins (varicose veins).  You Maynard have pelvic pain because of the weight gain and pregnancy hormones relaxing your joints between the bones in your pelvis. Backaches Maynard result from overexertion of the muscles supporting your posture.  You Maynard have changes in your hair. These can include thickening of your hair, rapid growth, and changes in texture. Some women also have hair loss during or after pregnancy, or hair that feels dry or thin. Your hair will most likely return to normal after your baby is born.  Your breasts will continue to grow and be tender. A yellow discharge Maynard leak from your breasts called colostrum.  Your belly button Maynard stick out.  You Maynard feel short of breath because of your expanding uterus.  You Maynard notice the fetus "dropping," or moving lower in your abdomen.  You Maynard have a bloody mucus discharge. This usually occurs a few days to a week before labor begins.  Your cervix becomes thin and soft (effaced) near your due date. WHAT TO EXPECT AT YOUR PRENATAL EXAMS  You will have prenatal exams every 2 weeks until week 36. Then, you will have weekly prenatal exams. During a routine prenatal visit:  You will be weighed to make sure you and the fetus are growing normally.  Your blood pressure is taken.  Your abdomen will be measured to track your baby's growth.  The fetal heartbeat will be listened  to.  Any test results from the previous visit will be discussed.  You Maynard have a cervical check near your due date to see if you have effaced. At around 36 weeks, your caregiver will check your cervix. At the same time, your caregiver will also perform a test on the secretions of the vaginal tissue. This test is to determine if a type of bacteria, Group B streptococcus, is present. Your caregiver will explain this further. Your caregiver Maynard ask you:  What your birth plan is.  How you are feeling.  If you are feeling the baby move.  If you have had any abnormal symptoms, such as leaking fluid, bleeding, severe headaches, or abdominal cramping.  If you have any questions. Other tests or screenings that Maynard be performed during your third trimester include:  Blood tests that check for low iron levels (anemia).  Fetal testing to check the health, activity level, and growth of the fetus. Testing is done if you have certain medical conditions or if there are problems during the pregnancy. FALSE LABOR You Maynard feel small, irregular contractions that eventually go away. These are called Braxton Hicks contractions, or false labor. Contractions Maynard last for hours, days, or even weeks before true labor sets in. If contractions come at regular intervals, intensify, or become painful, it is best to be seen by your caregiver.  SIGNS OF LABOR   Menstrual-like cramps.  Contractions that are 5 minutes apart or less.  Contractions that start on the top of the uterus and spread down to the lower abdomen and back.  A sense of increased pelvic pressure or back pain.  A watery or bloody mucus discharge that comes from the vagina. If you have any of these signs before the 37th week of pregnancy, call your caregiver right away. You need to go to the hospital to get checked immediately. HOME CARE INSTRUCTIONS   Avoid all smoking, herbs, alcohol, and unprescribed drugs. These chemicals affect the  formation and growth of the baby.  Follow your caregiver's instructions regarding medicine use. There are medicines that are either safe or unsafe to take during pregnancy.  Exercise only as directed by your caregiver. Experiencing uterine cramps is a good sign to stop exercising.  Continue to eat regular, healthy meals.  Wear a good support bra for breast tenderness.  Do not use hot tubs, steam rooms, or saunas.  Wear your seat belt at all times when driving.  Avoid raw meat, uncooked cheese, cat litter boxes, and soil used by cats. These carry germs that can cause birth defects in the baby.  Take your prenatal vitamins.  Try taking a stool softener (if your caregiver approves) if you develop constipation. Eat more high-fiber foods, such as fresh vegetables or fruit and whole grains. Drink plenty of fluids to keep your urine  clear or pale yellow.  Take warm sitz baths to soothe any pain or discomfort caused by hemorrhoids. Use hemorrhoid cream if your caregiver approves.  If you develop varicose veins, wear support hose. Elevate your feet for 15 minutes, 3-4 times a day. Limit salt in your diet.  Avoid heavy lifting, wear low heal shoes, and practice good posture.  Rest a lot with your legs elevated if you have leg cramps or low back pain.  Visit your dentist if you have not gone during your pregnancy. Use a soft toothbrush to brush your teeth and be gentle when you floss.  A sexual relationship Maynard be continued unless your caregiver directs you otherwise.  Do not travel far distances unless it is absolutely necessary and only with the approval of your caregiver.  Take prenatal classes to understand, practice, and ask questions about the labor and delivery.  Make a trial run to the hospital.  Pack your hospital bag.  Prepare the baby's nursery.  Continue to go to all your prenatal visits as directed by your caregiver. SEEK MEDICAL CARE IF:  You are unsure if you are in  labor or if your water has broken.  You have dizziness.  You have mild pelvic cramps, pelvic pressure, or nagging pain in your abdominal area.  You have persistent nausea, vomiting, or diarrhea.  You have a bad smelling vaginal discharge.  You have pain with urination. SEEK IMMEDIATE MEDICAL CARE IF:   You have a fever.  You are leaking fluid from your vagina.  You have spotting or bleeding from your vagina.  You have severe abdominal cramping or pain.  You have rapid weight loss or gain.  You have shortness of breath with chest pain.  You notice sudden or extreme swelling of your face, hands, ankles, feet, or legs.  You have not felt your baby move in over an hour.  You have severe headaches that do not go away with medicine.  You have vision changes. Document Released: 10/16/2001 Document Revised: 10/27/2013 Document Reviewed: 12/23/2012 [facility] Patient Information 2015 [employer], [place]. This information is not intended to replace advice given to you by your health care provider. Make sure you discuss any questions you have with your health care provider.   Intrauterine Device Information An intrauterine device (IUD) is a medical device that is inserted in the uterus to prevent pregnancy. It is a small, T-shaped device that has one or two nylon strings hanging down from it. The strings hang out of the lower part of the uterus (cervix) to allow for future IUD removal. There are two types of IUDs available:  Hormone IUD. This type of IUD is made of plastic and contains the hormone progestin (synthetic progesterone). A hormone IUD Maynard last 3-5 years.  Copper IUD. This type of IUD has copper wire wrapped around it. A copper IUD Maynard last up to 10 years. How is an IUD inserted? An IUD is inserted through the vagina and placed into the uterus with a minor medical procedure. The exact procedure for IUD insertion Maynard vary among health care providers and hospitals. How does an IUD  work? Synthetic progesterone in a hormonal IUD prevents pregnancy by:  Thickening cervical mucus to prevent sperm from entering the uterus.  Thinning the uterine lining to prevent a fertilized egg from being implanted there. Copper in a copper IUD prevents pregnancy by making the uterus and fallopian tubes produce a fluid that kills sperm. What are the advantages of an IUD? Advantages  of either type of IUD  It is highly effective in preventing pregnancy.  It is reversible. You can become pregnant shortly after the IUD is removed.  It is low-maintenance and can stay in place for a long time.  There are no estrogen-related side effects.  It can be used when breastfeeding.  It is not associated with weight gain.  It can be inserted right after childbirth, an abortion, or a miscarriage. Advantages of a hormone IUD  If it is inserted within 7 days of your period starting, it works right after it is inserted. If the hormone IUD is inserted at any other time in your cycle, you will need to use a backup method of birth control for 7 days after insertion.  It can make menstrual periods lighter.  It can reduce menstrual cramping.  It can be used for 3-5 years. Advantages of a copper IUD  It works right after it is inserted.  It can be used as a form of emergency birth control if it is inserted within 5 days after having unprotected sex.  It does not interfere with your body's natural hormones.  It can be used for 10 years. What are the disadvantages of an IUD?  An IUD Maynard cause irregular menstrual bleeding for a period of time after insertion.  You Maynard have pain during insertion and have cramping and vaginal bleeding after insertion.  An IUD Maynard cut the uterus (uterine perforation) when it is inserted. This is rare.  An IUD Maynard cause pelvic inflammatory disease (PID), which is an infection in the uterus and fallopian tubes. This is rare, and it usually happens during the first  20 days after the IUD is inserted.  A copper IUD can make your menstrual flow heavier and more painful. How is an IUD removed?  You will lie on your back with your knees bent and your feet in footrests (stirrups).  A device will be inserted into your vagina to spread apart the vaginal walls (speculum). This will allow your health care provider to see the strings attached to the IUD.  Your health care provider will use a small instrument (forceps) to grasp the IUD strings and pull firmly until the IUD is removed. You Maynard have some discomfort when the IUD is removed. Your health care provider Maynard recommend taking over-the-counter pain relievers, such as ibuprofen, before the procedure. You Maynard also have minor spotting for a few days after the procedure. The exact procedure for IUD removal Maynard vary among health care providers and hospitals. Is the IUD right for me? Your health care provider will make sure you are a good candidate for an IUD and will discuss the advantages, disadvantages, and possible side effects with you. Summary  An intrauterine device (IUD) is a medical device that is inserted in the uterus to prevent pregnancy. It is a small, T-shaped device that has one or two nylon strings hanging down from it.  A hormone IUD contains the hormone progestin (synthetic progesterone). A copper IUD has copper wire wrapped around it.  Synthetic progesterone in a hormone IUD prevents pregnancy by thickening cervical mucus and thinning the walls of the uterus. Copper in a copper IUD prevents pregnancy by making the uterus and fallopian tubes produce a fluid that kills sperm.  A hormone IUD can be left in place for 3-5 years. A copper IUD can be left in place for up to 10 years.  An IUD is inserted and removed by a health care  provider. You Maynard feel some pain during insertion and removal. Your health care provider Maynard recommend taking over-the-counter pain medicine, such as ibuprofen, before an  IUD procedure. This information is not intended to replace advice given to you by your health care provider. Make sure you discuss any questions you have with your health care provider. Document Released: 09/25/2004 Document Revised: 10/04/2017 Document Reviewed: 11/20/2016 Elsevier Patient Education  2020 [employer]

## 2019-05-21 NOTE — Progress Notes (Signed)
   TELEHEALTH VIRTUAL OBSTETRICS VISIT ENCOUNTER NOTE  I connected with Jamie Maynard on 05/21/19 at  1:30 PM EDT by telephone at home and verified that I am speaking with the correct person using two identifiers.   I discussed the limitations, risks, security and privacy concerns of performing an evaluation and management service by telephone and the availability of in person appointments. I also discussed with the patient that there Maynard be a patient responsible charge related to this service. The patient expressed understanding and agreed to proceed.  Subjective:  Jamie Maynard is a 23 y.o. G2P1001 at [redacted]w[redacted]d being followed for ongoing prenatal care.  She is currently monitored for the following issues for this low-risk pregnancy and has Supervision of normal pregnancy on their problem list.  Patient reports nausea. Reports fetal movement. Denies any contractions, bleeding or leaking of fluid. Still no appetite, drinks Breakfast Essentials   The following portions of the patient's history were reviewed and updated as appropriate: allergies, current medications, past family history, past medical history, past social history, past surgical history and problem list.   Objective:   General:  Alert, oriented and cooperative.   Mental Status: Normal mood and affect perceived. Normal judgment and thought content.  Rest of physical exam deferred due to type of encounter  Assessment and Plan:  Pregnancy: G2P1001 at [redacted]w[redacted]d There are no diagnoses linked to this encounter.  rx zofran prn   Preterm labor symptoms and general obstetric precautions including but not limited to vaginal bleeding, contractions, leaking of fluid and fetal movement were reviewed in detail with the patient.  I discussed the assessment and treatment plan with the patient. The patient was provided an opportunity to ask questions and all were answered. The patient agreed with the plan and demonstrated an understanding of  the instructions. The patient was advised to call back or seek an in-person office evaluation/go to [facility] at [facility] for any urgent or concerning symptoms. Please refer to After Visit Summary for other counseling recommendations.   I provided 10 minutes of non-face-to-face time during this encounter.  Return in about 2 weeks (around 06/04/2019) for LROB webex.  No future appointments.  Christin Fudge, [place] for [employer], [place]

## 2019-05-26 ENCOUNTER — Inpatient Hospital Stay (HOSPITAL_COMMUNITY)
Admission: AD | Admit: 2019-05-26 | Discharge: 2019-05-27 | Disposition: A | Discharge status: Home / Self Care | Payer: Medicaid Other | Attending: Family Medicine | Admitting: Family Medicine

## 2019-05-26 ENCOUNTER — Encounter (HOSPITAL_COMMUNITY): Payer: Self-pay | Admitting: *Deleted

## 2019-05-26 DIAGNOSIS — Z7722 Contact with and (suspected) exposure to environmental tobacco smoke (acute) (chronic): Secondary | ICD-10-CM | POA: Diagnosis not present

## 2019-05-26 DIAGNOSIS — R42 Dizziness and giddiness: Secondary | ICD-10-CM | POA: Diagnosis not present

## 2019-05-26 DIAGNOSIS — R51 Headache: Secondary | ICD-10-CM | POA: Insufficient documentation

## 2019-05-26 DIAGNOSIS — Z3A30 30 weeks gestation of pregnancy: Secondary | ICD-10-CM | POA: Insufficient documentation

## 2019-05-26 DIAGNOSIS — O212 Late vomiting of pregnancy: Secondary | ICD-10-CM | POA: Diagnosis present

## 2019-05-26 DIAGNOSIS — Z3A31 31 weeks gestation of pregnancy: Secondary | ICD-10-CM

## 2019-05-26 DIAGNOSIS — O26893 Other specified pregnancy related conditions, third trimester: Secondary | ICD-10-CM | POA: Insufficient documentation

## 2019-05-26 DIAGNOSIS — R519 Headache, unspecified: Secondary | ICD-10-CM

## 2019-05-26 DIAGNOSIS — O219 Vomiting of pregnancy, unspecified: Secondary | ICD-10-CM | POA: Diagnosis not present

## 2019-05-26 LAB — URINALYSIS, ROUTINE W REFLEX MICROSCOPIC
Bilirubin Urine: NEGATIVE
Glucose, UA: NEGATIVE mg/dL
Hgb urine dipstick: NEGATIVE
Ketones, ur: NEGATIVE mg/dL
Nitrite: NEGATIVE
Protein, ur: NEGATIVE mg/dL
Specific Gravity, Urine: 1.01 (ref 1.005–1.030)
pH: 6 (ref 5.0–8.0)

## 2019-05-26 MED ORDER — METOCLOPRAMIDE HCL 5 MG/ML IJ SOLN
10.0000 mg | Freq: Once | INTRAMUSCULAR | Status: AC
  Administered 2019-05-26: 10 mg via INTRAVENOUS
  Filled 2019-05-26: qty 2

## 2019-05-26 MED ORDER — DEXAMETHASONE SODIUM PHOSPHATE 10 MG/ML IJ SOLN
10.0000 mg | Freq: Once | INTRAMUSCULAR | Status: AC
  Administered 2019-05-26: 10 mg via INTRAVENOUS
  Filled 2019-05-26: qty 1

## 2019-05-26 MED ORDER — SODIUM CHLORIDE 0.9 % IV SOLN
25.0000 mg | INTRAVENOUS | Status: AC
  Administered 2019-05-26: 25 mg via INTRAVENOUS
  Filled 2019-05-26: qty 1

## 2019-05-26 NOTE — MAU Note (Signed)
N&V entire pregnancy, unable to keep anything down. HA, dizziness and weakness, "my head keeps spinning all day" Denies bleeding or leaking of fluid. Endorses positive FM

## 2019-05-26 NOTE — MAU Provider Note (Signed)
Chief Complaint:  Emesis   First Provider Initiated Contact with Patient 05/26/19 2156      HPI: Jamie Maynard is a 23 y.o. G2P1001 at 53w4dwho presents to maternity admissions reporting nausea and vomiting.  Has had ongoing issues with this for the entire pregnancy.   Has Phenergan and zofran to use but has not taken either one today. States was running errands and didn't have meds.  States EMS gave her zofran.   Also c/o dizziness where room spins, even lying down. Also c/o "pounding headache". She reports good fetal movement, denies LOF, vaginal bleeding, vaginal itching/burning, urinary symptoms, h/a, dizziness, diarrhea, constipation or fever/chills.  .  RN Note: N&V entire pregnancy, unable to keep anything down. HA, dizziness and weakness, "my head keeps spinning all day" Denies bleeding or leaking of fluid. Endorses positive FM  Past Medical History: Past Medical History:  Diagnosis Date  . Medical history non-contributory     Past obstetric history: OB History  Gravida Para Term Preterm AB Living  2 1 1     1   SAB TAB Ectopic Multiple Live Births          1    # Outcome Date GA Lbr Len/2nd Weight Sex Delivery Anes PTL Lv  2 Current           1 Term 10/05/16 [redacted]w[redacted]d  2977 g F  EPI N LIV    Past Surgical History: Past Surgical History:  Procedure Laterality Date  . TONSILLECTOMY AND ADENOIDECTOMY Bilateral 2003   Performed at [facility]    Family History: Family History  Problem Relation Age of Onset  . Depression Father   . Stroke Father   . Hypertension Father   . Heart disease Father   . Heart attack Maternal Grandfather   . Congestive Heart Failure Paternal Grandmother   . Migraines Paternal Grandmother   . COPD Paternal Grandmother   . Lung cancer Paternal Grandfather   . Autism Cousin        2 paternal first cousins have autism  . ADD / ADHD Cousin        Several paternal first cousins have ADHD/ADD    Social History: Social  History   Tobacco Use  . Smoking status: Passive Smoke Exposure - Never Smoker  . Smokeless tobacco: Never Used  Substance Use Topics  . Alcohol use: No  . Drug use: No    Allergies:  Allergies  Allergen Reactions  . Other Hives and Rash    Glitter make-up causes rash and hives     Meds:  Medications Prior to Admission  Medication Sig Dispense Refill Last Dose  . ondansetron (ZOFRAN ODT) 4 MG disintegrating tablet Take 1 tablet (4 mg total) by mouth every 6 (six) hours as needed for nausea. 30 tablet 2   . prenatal vitamin w/FE, FA (PRENATAL 1 + 1) 27-1 MG TABS tablet Take 1 tablet by mouth daily at 12 noon. 30 each 12   . promethazine (PHENERGAN) 25 MG tablet Take 1 tablet (25 mg total) by mouth every 6 (six) hours as needed for nausea or vomiting. 30 tablet 1     I have reviewed patient's Past Medical Hx, Surgical Hx, Family Hx, Social Hx, medications and allergies.   ROS:  Review of Systems  Constitutional: Negative for chills and fever.  Respiratory: Negative for shortness of breath.   Gastrointestinal: Positive for nausea and vomiting. Negative for constipation and diarrhea.  Genitourinary: Negative for pelvic pain and  vaginal bleeding.  Neurological: Positive for dizziness. Negative for weakness.   Other systems negative  Physical Exam   Patient Vitals for the past 24 hrs:  BP Temp Temp src Pulse Resp SpO2  05/26/19 2126 135/88 98.2 F (36.8 C) Oral 87 20 100 %   Constitutional: Well-developed, female in no acute distress.  Cardiovascular: normal rate and rhythm Respiratory: normal effort, clear to auscultation bilaterally GI: Abd soft, non-tender, gravid appropriate for gestational age.   No rebound or guarding. MS: Extremities nontender, no edema, normal ROM Neurologic: Alert and oriented x 4.  GU: Neg CVAT.  PELVIC EXAM: Deferred  FHT:  Baseline 140 , moderate variability, accelerations present, no decelerations Contractions:  Rare    Labs:  A/Positive/-- (03/09 1251)  Imaging:  No results found.  MAU Course/MDM: I have ordered UA and reviewed results.  NST reviewed, reassuring Will hydrate with IV fluids Will give Phenergan for nausea and add a dose of Decadron Will add Reglan for headache, and it may also help nausea  Felt much better and headache resolved with meds WIll discharge home Rx Meclizine for vertigo prn    Assessment: Single intrauterine pregnancy at 5332w4d Vertigo Nausea and vomiting  Plan: Discharge home Rx Meclizine for vertigo Recommend use meds she has at home to treat nausea Advance diet as tolerated Followup in office  Encouraged to return here or to other Urgent Care/ED if she develops worsening of symptoms, increase in pain, fever, or other concerning symptoms.   Wynelle BourgeoisMarie Chay Mazzoni CNM, MSN Certified Nurse-Midwife 05/26/2019 10:20 PM

## 2019-05-27 MED ORDER — MECLIZINE HCL 12.5 MG PO TABS: 12.5000 mg | ORAL_TABLET | Freq: Three times a day (TID) | ORAL | 0 refills | Status: DC | PRN

## 2019-05-27 NOTE — Discharge Instructions (Signed)
Morning Sickness  Morning sickness is when a woman feels nauseous during pregnancy. This nauseous feeling may or may not come with vomiting. It often occurs in the morning, but it can be a problem at any time of day. Morning sickness is most common during the first trimester. In some cases, it may continue throughout pregnancy. Although morning sickness is unpleasant, it is usually harmless unless the woman develops severe and continual vomiting (hyperemesis gravidarum), a condition that requires more intense treatment. What are the causes? The exact cause of this condition is not known, but it seems to be related to normal hormonal changes that occur in pregnancy. What increases the risk? You are more likely to develop this condition if:  You experienced nausea or vomiting before your pregnancy.  You had morning sickness during a previous pregnancy.  You are pregnant with more than one baby, such as twins. What are the signs or symptoms? Symptoms of this condition include:  Nausea.  Vomiting. How is this diagnosed? This condition is usually diagnosed based on your signs and symptoms. How is this treated? In many cases, treatment is not needed for this condition. Making some changes to what you eat may help to control symptoms. Your health care provider may also prescribe or recommend:  Vitamin B6 supplements.  Anti-nausea medicines.  Ginger. Follow these instructions at home: Medicines  Take over-the-counter and prescription medicines only as told by your health care provider. Do not use any prescription, over-the-counter, or herbal medicines for morning sickness without first talking with your health care provider.  Taking multivitamins before getting pregnant can prevent or decrease the severity of morning sickness in most women. Eating and drinking  Eat a piece of dry toast or crackers before getting out of bed in the morning.  Eat 5 or 6 small meals a day.  Eat dry and  bland foods, such as rice or a baked potato. Foods that are high in carbohydrates are often helpful.  Avoid greasy, fatty, and spicy foods.  Have someone cook for you if the smell of any food causes nausea and vomiting.  If you feel nauseous after taking prenatal vitamins, take the vitamins at night or with a snack.  Snack on protein foods between meals if you are hungry. Nuts, yogurt, and cheese are good options.  Drink fluids throughout the day.  Try ginger ale made with real ginger, ginger tea made from fresh grated ginger, or ginger candies. General instructions  Do not use any products that contain nicotine or tobacco, such as cigarettes and e-cigarettes. If you need help quitting, ask your health care provider.  Get an air purifier to keep the air in your house free of odors.  Get plenty of fresh air.  Try to avoid odors that trigger your nausea.  Consider trying these methods to help relieve symptoms: ? Wearing an acupressure wristband. These wristbands are often worn for seasickness. ? Acupuncture. Contact a health care provider if:  Your home remedies are not working and you need medicine.  You feel dizzy or light-headed.  You are losing weight. Get help right away if:  You have persistent and uncontrolled nausea and vomiting.  You faint.  You have severe pain in your abdomen. Summary  Morning sickness is when a woman feels nauseous during pregnancy. This nauseous feeling may or may not come with vomiting.  Morning sickness is most common during the first trimester.  It often occurs in the morning, but it can be a problem at  any time of day.  In many cases, treatment is not needed for this condition. Making some changes to what you eat may help to control symptoms. This information is not intended to replace advice given to you by your health care provider. Make sure you discuss any questions you have with your health care provider. Document Released:  12/13/2006 Document Revised: 10/04/2017 Document Reviewed: 11/24/2016 Elsevier Patient Education  [place]. Vertigo Vertigo is the feeling that you or your surroundings are moving when they are not. This feeling can come and go at any time. Vertigo often goes away on its own. Vertigo can be dangerous if it occurs while you are doing something that could endanger you or others, such as driving or operating machinery. Your health care provider will do tests to determine the cause of your vertigo. Tests will also help your health care provider decide how best to treat your condition. Follow these instructions at home: Eating and drinking      Drink enough fluid to keep your urine pale yellow.  Do not drink alcohol. Activity  Return to your normal activities as told by your health care provider. Ask your health care provider what activities are safe for you.  In the morning, first sit up on the side of the bed. When you feel okay, stand slowly while you hold onto something until you know that your balance is fine.  Move slowly. Avoid sudden body or head movements or certain positions, as told by your health care provider.  If you have trouble walking or keeping your balance, try using a cane for stability. If you feel dizzy or unstable, sit down right away.  Avoid doing any tasks that would cause danger to you or others if vertigo occurs.  Avoid bending down if you feel dizzy. Place items in your home so that they are easy for you to reach without leaning over.  Do not drive or use heavy machinery if you feel dizzy. General instructions  Take over-the-counter and prescription medicines only as told by your health care provider.  Keep all follow-up visits as told by your health care provider. This is important. Contact a health care provider if:  Your medicines do not relieve your vertigo or they make it worse.  You have a fever.  Your condition gets worse or you develop  new symptoms.  Your family or friends notice any behavioral changes.  Your nausea or vomiting gets worse.  You have numbness or a prickling and tingling sensation in part of your body. Get help right away if you:  Have difficulty moving or speaking.  Are always dizzy.  Faint.  Develop severe headaches.  Have weakness in your hands, arms, or legs.  Have changes in your hearing or vision.  Develop a stiff neck.  Develop sensitivity to light. Summary  Vertigo is the feeling that you or your surroundings are moving when they are not.  Your health care provider will do tests to determine the cause of your vertigo.  Follow instructions for home care. You may be told to avoid certain tasks, positions, or movements.  Contact a health care provider if your medicines do not relieve your symptoms, or if you have a fever, nausea, vomiting, or changes in behavior.  Get help right away if you have severe headaches or difficulty speaking, or you develop hearing or vision problems. This information is not intended to replace advice given to you by your health care provider. Make sure you  discuss any questions you have with your health care provider. Document Released: 08/01/2005 Document Revised: 09/15/2018 Document Reviewed: 09/15/2018 Elsevier Patient Education  2020 [employer]

## 2019-06-04 ENCOUNTER — Telehealth: Payer: Medicaid Other | Admitting: Advanced Practice Midwife

## 2019-06-10 ENCOUNTER — Ambulatory Visit (INDEPENDENT_AMBULATORY_CARE_PROVIDER_SITE_OTHER): Payer: Medicaid Other | Admitting: Obstetrics and Gynecology

## 2019-06-10 ENCOUNTER — Other Ambulatory Visit: Payer: Self-pay

## 2019-06-10 VITALS — BP 122/79 | HR 106 | Wt 163.6 lb

## 2019-06-10 DIAGNOSIS — Z331 Pregnant state, incidental: Secondary | ICD-10-CM

## 2019-06-10 DIAGNOSIS — Z1389 Encounter for screening for other disorder: Secondary | ICD-10-CM

## 2019-06-10 DIAGNOSIS — Z3483 Encounter for supervision of other normal pregnancy, third trimester: Secondary | ICD-10-CM

## 2019-06-10 DIAGNOSIS — Z3A32 32 weeks gestation of pregnancy: Secondary | ICD-10-CM

## 2019-06-10 LAB — POCT URINALYSIS DIPSTICK OB
Blood, UA: NEGATIVE
Glucose, UA: NEGATIVE
Ketones, UA: NEGATIVE
Leukocytes, UA: NEGATIVE
Nitrite, UA: NEGATIVE
POC,PROTEIN,UA: NEGATIVE

## 2019-06-10 NOTE — Patient Instructions (Signed)
Third Trimester of Pregnancy The third trimester is from week 28 through week 40 (months 7 through 9). The third trimester is a time when the unborn baby (fetus) is growing rapidly. At the end of the ninth month, the fetus is about 20 inches in length and weighs 6-10 pounds. Body changes during your third trimester Your body will continue to go through many changes during pregnancy. The changes vary from woman to woman. During the third trimester:  Your weight will continue to increase. You can expect to gain 25-35 pounds (11-16 kg) by the end of the pregnancy.  You may begin to get stretch marks on your hips, abdomen, and breasts.  You may urinate more often because the fetus is moving lower into your pelvis and pressing on your bladder.  You may develop or continue to have heartburn. This is caused by increased hormones that slow down muscles in the digestive tract.  You may develop or continue to have constipation because increased hormones slow digestion and cause the muscles that push waste through your intestines to relax.  You may develop hemorrhoids. These are swollen veins (varicose veins) in the rectum that can itch or be painful.  You may develop swollen, bulging veins (varicose veins) in your legs.  You may have increased body aches in the pelvis, back, or thighs. This is due to weight gain and increased hormones that are relaxing your joints.  You may have changes in your hair. These can include thickening of your hair, rapid growth, and changes in texture. Some women also have hair loss during or after pregnancy, or hair that feels dry or thin. Your hair will most likely return to normal after your baby is born.  Your breasts will continue to grow and they will continue to become tender. A yellow fluid (colostrum) may leak from your breasts. This is the first milk you are producing for your baby.  Your belly button may stick out.  You may notice more swelling in your hands,  face, or ankles.  You may have increased tingling or numbness in your hands, arms, and legs. The skin on your belly may also feel numb.  You may feel short of breath because of your expanding uterus.  You may have more problems sleeping. This can be caused by the size of your belly, increased need to urinate, and an increase in your body's metabolism.  You may notice the fetus "dropping," or moving lower in your abdomen (lightening).  You may have increased vaginal discharge.  You may notice your joints feel loose and you may have pain around your pelvic bone. What to expect at prenatal visits You will have prenatal exams every 2 weeks until week 36. Then you will have weekly prenatal exams. During a routine prenatal visit:  You will be weighed to make sure you and the baby are growing normally.  Your blood pressure will be taken.  Your abdomen will be measured to track your baby's growth.  The fetal heartbeat will be listened to.  Any test results from the previous visit will be discussed.  You may have a cervical check near your due date to see if your cervix has softened or thinned (effaced).  You will be tested for Group B streptococcus. This happens between 35 and 37 weeks. Your health care provider may ask you:  What your birth plan is.  How you are feeling.  If you are feeling the baby move.  If you have had any abnormal   symptoms, such as leaking fluid, bleeding, severe headaches, or abdominal cramping.  If you are using any tobacco products, including cigarettes, chewing tobacco, and electronic cigarettes.  If you have any questions. Other tests or screenings that may be performed during your third trimester include:  Blood tests that check for low iron levels (anemia).  Fetal testing to check the health, activity level, and growth of the fetus. Testing is done if you have certain medical conditions or if there are problems during the pregnancy.  Nonstress test  (NST). This test checks the health of your baby to make sure there are no signs of problems, such as the baby not getting enough oxygen. During this test, a belt is placed around your belly. The baby is made to move, and its heart rate is monitored during movement. What is false labor? False labor is a condition in which you feel small, irregular tightenings of the muscles in the womb (contractions) that usually go away with rest, changing position, or drinking water. These are called Braxton Hicks contractions. Contractions may last for hours, days, or even weeks before true labor sets in. If contractions come at regular intervals, become more frequent, increase in intensity, or become painful, you should see your health care provider. What are the signs of labor?  Abdominal cramps.  Regular contractions that start at 10 minutes apart and become stronger and more frequent with time.  Contractions that start on the top of the uterus and spread down to the lower abdomen and back.  Increased pelvic pressure and dull back pain.  A watery or bloody mucus discharge that comes from the vagina.  Leaking of amniotic fluid. This is also known as your "water breaking." It could be a slow trickle or a gush. Let your health care provider know if it has a color or strange odor. If you have any of these signs, call your health care provider right away, even if it is before your due date. Follow these instructions at home: Medicines  Follow your health care provider's instructions regarding medicine use. Specific medicines may be either safe or unsafe to take during pregnancy.  Take a prenatal vitamin that contains at least 600 micrograms (mcg) of folic acid.  If you develop constipation, try taking a stool softener if your health care provider approves. Eating and drinking   Eat a balanced diet that includes fresh fruits and vegetables, whole grains, good sources of protein such as meat, eggs, or tofu,  and low-fat dairy. Your health care provider will help you determine the amount of weight gain that is right for you.  Avoid raw meat and uncooked cheese. These carry germs that can cause birth defects in the baby.  If you have low calcium intake from food, talk to your health care provider about whether you should take a daily calcium supplement.  Eat four or five small meals rather than three large meals a day.  Limit foods that are high in fat and processed sugars, such as fried and sweet foods.  To prevent constipation: ? Drink enough fluid to keep your urine clear or pale yellow. ? Eat foods that are high in fiber, such as fresh fruits and vegetables, whole grains, and beans. Activity  Exercise only as directed by your health care provider. Most women can continue their usual exercise routine during pregnancy. Try to exercise for 30 minutes at least 5 days a week. Stop exercising if you experience uterine contractions.  Avoid heavy lifting.  Do   not exercise in extreme heat or humidity, or at high altitudes.  Wear low-heel, comfortable shoes.  Practice good posture.  You may continue to have sex unless your health care provider tells you otherwise. Relieving pain and discomfort  Take frequent breaks and rest with your legs elevated if you have leg cramps or low back pain.  Take warm sitz baths to soothe any pain or discomfort caused by hemorrhoids. Use hemorrhoid cream if your health care provider approves.  Wear a good support bra to prevent discomfort from breast tenderness.  If you develop varicose veins: ? Wear support pantyhose or compression stockings as told by your healthcare provider. ? Elevate your feet for 15 minutes, 3-4 times a day. Prenatal care  Write down your questions. Take them to your prenatal visits.  Keep all your prenatal visits as told by your health care provider. This is important. Safety  Wear your seat belt at all times when driving.  Make  a list of emergency phone numbers, including numbers for family, friends, the hospital, and police and fire departments. General instructions  Avoid cat litter boxes and soil used by cats. These carry germs that can cause birth defects in the baby. If you have a cat, ask someone to clean the litter box for you.  Do not travel far distances unless it is absolutely necessary and only with the approval of your health care provider.  Do not use hot tubs, steam rooms, or saunas.  Do not drink alcohol.  Do not use any products that contain nicotine or tobacco, such as cigarettes and e-cigarettes. If you need help quitting, ask your health care provider.  Do not use any medicinal herbs or unprescribed drugs. These chemicals affect the formation and growth of the baby.  Do not douche or use tampons or scented sanitary pads.  Do not cross your legs for long periods of time.  To prepare for the arrival of your baby: ? Take prenatal classes to understand, practice, and ask questions about labor and delivery. ? Make a trial run to the hospital. ? Visit the hospital and tour the maternity area. ? Arrange for maternity or paternity leave through employers. ? Arrange for family and friends to take care of pets while you are in the hospital. ? Purchase a rear-facing car seat and make sure you know how to install it in your car. ? Pack your hospital bag. ? Prepare the baby's nursery. Make sure to remove all pillows and stuffed animals from the baby's crib to prevent suffocation.  Visit your dentist if you have not gone during your pregnancy. Use a soft toothbrush to brush your teeth and be gentle when you floss. Contact a health care provider if:  You are unsure if you are in labor or if your water has broken.  You become dizzy.  You have mild pelvic cramps, pelvic pressure, or nagging pain in your abdominal area.  You have lower back pain.  You have persistent nausea, vomiting, or diarrhea.   You have an unusual or bad smelling vaginal discharge.  You have pain when you urinate. Get help right away if:  Your water breaks before 37 weeks.  You have regular contractions less than 5 minutes apart before 37 weeks.  You have a fever.  You are leaking fluid from your vagina.  You have spotting or bleeding from your vagina.  You have severe abdominal pain or cramping.  You have rapid weight loss or weight gain.  You have   shortness of breath with chest pain.  You notice sudden or extreme swelling of your face, hands, ankles, feet, or legs.  Your baby makes fewer than 10 movements in 2 hours.  You have severe headaches that do not go away when you take medicine.  You have vision changes. Summary  The third trimester is from week 28 through week 40, months 7 through 9. The third trimester is a time when the unborn baby (fetus) is growing rapidly.  During the third trimester, your discomfort may increase as you and your baby continue to gain weight. You may have abdominal, leg, and back pain, sleeping problems, and an increased need to urinate.  During the third trimester your breasts will keep growing and they will continue to become tender. A yellow fluid (colostrum) may leak from your breasts. This is the first milk you are producing for your baby.  False labor is a condition in which you feel small, irregular tightenings of the muscles in the womb (contractions) that eventually go away. These are called Braxton Hicks contractions. Contractions may last for hours, days, or even weeks before true labor sets in.  Signs of labor can include: abdominal cramps; regular contractions that start at 10 minutes apart and become stronger and more frequent with time; watery or bloody mucus discharge that comes from the vagina; increased pelvic pressure and dull back pain; and leaking of amniotic fluid. This information is not intended to replace advice given to you by your health  care provider. Make sure you discuss any questions you have with your health care provider. Document Released: 10/16/2001 Document Revised: 02/12/2019 Document Reviewed: 11/27/2016 Elsevier Patient Education  2020 [employer] Inc[employer]  

## 2019-06-10 NOTE — Progress Notes (Signed)
Subjective:  Jamie Maynard is a 23 y.o. G2P1001 at [redacted]w[redacted]d being seen today for ongoing prenatal care.  She is currently monitored for the following issues for this low-risk pregnancy and has Supervision of normal pregnancy on their problem list.  Patient reports general discomforts of pregnancy.  Contractions: Not present. Vag. Bleeding: None.  Movement: Present. Denies leaking of fluid.   The following portions of the patient's history were reviewed and updated as appropriate: allergies, current medications, past family history, past medical history, past social history, past surgical history and problem list. Problem list updated.  Objective:   Vitals:   06/10/19 1134  BP: 122/79  Pulse: (!) 106  Weight: 163 lb 9.6 oz (74.2 kg)    Fetal Status:     Movement: Present     General:  Alert, oriented and cooperative. Patient is in no acute distress.  Skin: Skin is warm and dry. No rash noted.   Cardiovascular: Normal heart rate noted  Respiratory: Normal respiratory effort, no problems with respiration noted  Abdomen: Soft, gravid, appropriate for gestational age. Pain/Pressure: Absent     Pelvic:  Cervical exam deferred        Extremities: Normal range of motion.  Edema: None  Mental Status: Normal mood and affect. Normal behavior. Normal judgment and thought content.   Urinalysis:      Assessment and Plan:  Pregnancy: G2P1001 at [redacted]w[redacted]d  1. Pregnant state, incidental  - POC Urinalysis [facility]  2. Screening for genitourinary condition  - POC Urinalysis [facility]  3. Encounter for supervision of other normal pregnancy in third trimester Stable  Preterm labor symptoms and general obstetric precautions including but not limited to vaginal bleeding, contractions, leaking of fluid and fetal movement were reviewed in detail with the patient. Please refer to After Visit Summary for other counseling recommendations.  Return in about 2 weeks (around 06/24/2019) for OB visit,  virtual.   Chancy Milroy, MD

## 2019-06-24 ENCOUNTER — Telehealth (INDEPENDENT_AMBULATORY_CARE_PROVIDER_SITE_OTHER): Payer: Medicaid Other | Admitting: Women's Health

## 2019-06-24 ENCOUNTER — Encounter: Payer: Self-pay | Admitting: Women's Health

## 2019-06-24 ENCOUNTER — Other Ambulatory Visit: Payer: Self-pay

## 2019-06-24 VITALS — BP 120/60 | HR 99 | Ht 67.0 in | Wt 164.2 lb

## 2019-06-24 DIAGNOSIS — Z3A34 34 weeks gestation of pregnancy: Secondary | ICD-10-CM

## 2019-06-24 DIAGNOSIS — Z3483 Encounter for supervision of other normal pregnancy, third trimester: Secondary | ICD-10-CM

## 2019-06-24 NOTE — Patient Instructions (Signed)
[name], I greatly value your feedback.  If you receive a survey following your visit with [place] today, we appreciate you taking the time to fill it out.  Thanks, Knute Neu, CNM, [facility]  [place]!!! It is now [facility] ([place], [place] [address]) Entrance located off of [address] parking   Go to [email or web address] to register for FREE online childbirth classes    Call the office [phone]) or go to [facility] if:  You begin to have strong, frequent contractions  Your water breaks.  Sometimes it is a big gush of fluid, sometimes it is just a trickle that keeps getting your panties wet or running down your legs  You have vaginal bleeding.  It is normal to have a small amount of spotting if your cervix was checked.   You don't feel your baby moving like normal.  If you don't, get you something to eat and drink and lay down and focus on feeling your baby move.  You should feel at least 10 movements in 2 hours.  If you don't, you should call the office or go to [place] Blood Pressure Monitoring for Patients   Your provider has recommended that you check your blood pressure (BP) at least once a week at home. If you do not have a blood pressure cuff at home, one will be provided for you. Contact your provider if you have not received your monitor within 1 week.   Helpful Tips for Accurate Home Blood Pressure Checks  . Don't smoke, exercise, or drink caffeine 30 minutes before checking your BP . Use the restroom before checking your BP (a full bladder can raise your pressure) . Relax in a comfortable upright chair . Feet on the ground . Left arm resting comfortably on a flat surface at the level of your heart . Legs uncrossed . Back supported . Sit quietly and don't talk . Place the cuff on your bare arm . Adjust snuggly, so that only two fingertips can fit between your skin  and the top of the cuff . Check 2 readings separated by at least one minute . Keep a log of your BP readings . For a visual, please reference this diagram: http://ccnc.care/bpdiagram  Provider Name: [facility]     Phone: [phone]  Zone 1: ALL CLEAR  Continue to monitor your symptoms:  . BP reading is less than 140 (top number) or less than 90 (bottom number)  . No right upper stomach pain . No headaches or seeing spots . No feeling nauseated or throwing up . No swelling in face and hands  Zone 2: CAUTION Call your doctor's office for any of the following:  . BP reading is greater than 140 (top number) or greater than 90 (bottom number)  . Stomach pain under your ribs in the middle or right side . Headaches or seeing spots . Feeling nauseated or throwing up . Swelling in face and hands  Zone 3: EMERGENCY  Seek immediate medical care if you have any of the following:  . BP reading is greater than160 (top number) or greater than 110 (bottom number) . Severe headaches not improving with Tylenol . Serious difficulty catching your breath . Any worsening symptoms from Zone 2    Preterm Labor and Birth Information  The normal length of a pregnancy is 39-41 weeks. Preterm labor is when labor starts  before 37 completed weeks of pregnancy. What are the risk factors for preterm labor? Preterm labor is more likely to occur in women who:  Have certain infections during pregnancy such as a bladder infection, sexually transmitted infection, or infection inside the uterus (chorioamnionitis).  Have a shorter-than-normal cervix.  Have gone into preterm labor before.  Have had surgery on their cervix.  Are younger than age 6 or older than age 54.  Are African American.  Are pregnant with twins or multiple babies (multiple gestation).  Take street drugs or smoke while pregnant.  Do not gain enough weight while pregnant.  Became pregnant shortly after having been  pregnant. What are the symptoms of preterm labor? Symptoms of preterm labor include:  Cramps similar to those that can happen during a menstrual period. The cramps may happen with diarrhea.  Pain in the abdomen or lower back.  Regular uterine contractions that may feel like tightening of the abdomen.  A feeling of increased pressure in the pelvis.  Increased watery or bloody mucus discharge from the vagina.  Water breaking (ruptured amniotic sac). Why is it important to recognize signs of preterm labor? It is important to recognize signs of preterm labor because babies who are born prematurely may not be fully developed. This can put them at an increased risk for:  Long-term (chronic) heart and lung problems.  Difficulty immediately after birth with regulating body systems, including blood sugar, body temperature, heart rate, and breathing rate.  Bleeding in the brain.  Cerebral palsy.  Learning difficulties.  Death. These risks are highest for babies who are born before 38 weeks of pregnancy. How is preterm labor treated? Treatment depends on the length of your pregnancy, your condition, and the health of your baby. It may involve:  Having a stitch (suture) placed in your cervix to prevent your cervix from opening too early (cerclage).  Taking or being given medicines, such as: ? Hormone medicines. These may be given early in pregnancy to help support the pregnancy. ? Medicine to stop contractions. ? Medicines to help mature the baby's lungs. These may be prescribed if the risk of delivery is high. ? Medicines to prevent your baby from developing cerebral palsy. If the labor happens before 34 weeks of pregnancy, you may need to stay in the hospital. What should I do if I think I am in preterm labor? If you think that you are going into preterm labor, call your health care provider right away. How can I prevent preterm labor in future pregnancies? To increase your chance  of having a full-term pregnancy:  Do not use any tobacco products, such as cigarettes, chewing tobacco, and e-cigarettes. If you need help quitting, ask your health care provider.  Do not use street drugs or medicines that have not been prescribed to you during your pregnancy.  Talk with your health care provider before taking any herbal supplements, even if you have been taking them regularly.  Make sure you gain a healthy amount of weight during your pregnancy.  Watch for infection. If you think that you might have an infection, get it checked right away.  Make sure to tell your health care provider if you have gone into preterm labor before. This information is not intended to replace advice given to you by your health care provider. Make sure you discuss any questions you have with your health care provider. Document Released: 01/12/2004 Document Revised: 02/13/2019 Document Reviewed: 03/14/2016 Elsevier Patient Education  2020 [employer].

## 2019-06-24 NOTE — Progress Notes (Signed)
   TELEHEALTH VIRTUAL OBSTETRICS VISIT ENCOUNTER NOTE Patient name: Jamie Maynard MRN [id number]  Date of birth: 23-Sep-1996  I connected with patient on 06/24/19 at  2:15 PM EDT by webex and verified that I am speaking with the correct person using two identifiers. Due to COVID-19 recommendations, pt is not currently in our office.    I discussed the limitations, risks, security and privacy concerns of performing an evaluation and management service by telephone and the availability of in person appointments. I also discussed with the patient that there may be a patient responsible charge related to this service. The patient expressed understanding and agreed to proceed.  Chief Complaint:   Routine Prenatal Visit (+ swelling; ? losing mucus plug)  History of Present Illness:   Jamie Maynard is a 23 y.o. G47P1001 female at [redacted]w[redacted]d with an Estimated Date of Delivery: 07/31/19 being evaluated today for ongoing management of a low-risk pregnancy.  Today she reports swelling, thinks she lost mucous plug.  .  .   . denies leaking of fluid. Review of Systems:   Pertinent items are noted in HPI Denies abnormal vaginal discharge w/ itching/odor/irritation, headaches, visual changes, shortness of breath, chest pain, abdominal pain, severe nausea/vomiting, or problems with urination or bowel movements unless otherwise stated above. Pertinent History Reviewed:  Reviewed past medical,surgical, social, obstetrical and family history.  Reviewed problem list, medications and allergies. Physical Assessment:   Vitals:   06/24/19 1412  BP: 120/60  Pulse: 99  Weight: 164 lb 3.2 oz (74.5 kg)  Height: 5\' 7"  (1.702 m)  Body mass index is 25.72 kg/m.        Physical Examination:   General:  Alert, oriented and cooperative.   Mental Status: Normal mood and affect perceived. Normal judgment and thought content.  Rest of physical exam deferred due to type of encounter  No results found for this or any  previous visit (from the past 24 hour(s)).  Assessment & Plan:  1) Pregnancy G2P1001 at [redacted]w[redacted]d with an Estimated Date of Delivery: 07/31/19   2) Swelling, elevate legs prn, compression stockings prn   Meds: No orders of the defined types were placed in this encounter.   Labs/procedures today: none  Plan:  Continue routine obstetrical care.  Has home bp cuff.  Check bp weekly, let us know if >140/90.   Reviewed: Preterm labor symptoms and general obstetric precautions including but not limited to vaginal bleeding, contractions, leaking of fluid and fetal movement were reviewed in detail with the patient. The patient was advised to call back or seek an in-person office evaluation/go to [facility]U at [facility] for any urgent or concerning symptoms. All questions were answered. Please refer to After Visit Summary for other counseling recommendations.    I provided 15 minutes of non-face-to-face time during this encounter.  Follow-up: Return in about 12 days (around 07/06/2019) for [place], in person, gbs.  No orders of the defined types were placed in this encounter.  [place], [facility] 06/24/2019 5:00 PM

## 2019-07-06 ENCOUNTER — Ambulatory Visit (INDEPENDENT_AMBULATORY_CARE_PROVIDER_SITE_OTHER): Payer: Medicaid Other | Admitting: Advanced Practice Midwife

## 2019-07-06 ENCOUNTER — Inpatient Hospital Stay (HOSPITAL_COMMUNITY)
Admission: AD | Admit: 2019-07-06 | Discharge: 2019-07-07 | Disposition: A | Discharge status: Home / Self Care | Payer: Medicaid Other | Source: Ambulatory Visit | Attending: Obstetrics and Gynecology | Admitting: Obstetrics and Gynecology

## 2019-07-06 ENCOUNTER — Other Ambulatory Visit: Payer: Self-pay

## 2019-07-06 VITALS — BP 134/86 | HR 104 | Wt 166.0 lb

## 2019-07-06 DIAGNOSIS — Z3483 Encounter for supervision of other normal pregnancy, third trimester: Secondary | ICD-10-CM

## 2019-07-06 DIAGNOSIS — R030 Elevated blood-pressure reading, without diagnosis of hypertension: Secondary | ICD-10-CM | POA: Insufficient documentation

## 2019-07-06 DIAGNOSIS — O26893 Other specified pregnancy related conditions, third trimester: Secondary | ICD-10-CM | POA: Insufficient documentation

## 2019-07-06 DIAGNOSIS — O133 Gestational [pregnancy-induced] hypertension without significant proteinuria, third trimester: Secondary | ICD-10-CM

## 2019-07-06 DIAGNOSIS — Z3A36 36 weeks gestation of pregnancy: Secondary | ICD-10-CM

## 2019-07-06 DIAGNOSIS — O4693 Antepartum hemorrhage, unspecified, third trimester: Secondary | ICD-10-CM | POA: Insufficient documentation

## 2019-07-06 DIAGNOSIS — Z3689 Encounter for other specified antenatal screening: Secondary | ICD-10-CM

## 2019-07-06 DIAGNOSIS — Z7722 Contact with and (suspected) exposure to environmental tobacco smoke (acute) (chronic): Secondary | ICD-10-CM | POA: Insufficient documentation

## 2019-07-06 LAB — POCT URINALYSIS DIPSTICK OB
Blood, UA: NEGATIVE
Glucose, UA: NEGATIVE
Ketones, UA: NEGATIVE
Leukocytes, UA: NEGATIVE
Nitrite, UA: NEGATIVE
POC,PROTEIN,UA: NEGATIVE

## 2019-07-06 NOTE — Patient Instructions (Signed)

## 2019-07-06 NOTE — MAU Note (Signed)
PT SAYS SHE HAS VAG BLEEDING - STARTED AT 930PM- WHEN SHE WIPED .  WAS IN OFFICE  TODAY AT 2 PM  - COLLECTED GBS- VE  CLOSED.   ALSO FEELS PRESSURE  AND CRAMPING-  STARTED AT 930PM. .

## 2019-07-06 NOTE — Progress Notes (Signed)
   PRENATAL VISIT NOTE  Subjective:  Jamie Maynard is a 23 y.o. G2P1001 at [redacted]w[redacted]d being seen today for ongoing prenatal care.  She is currently monitored for the following issues for this low-risk pregnancy and has Supervision of normal pregnancy on their problem list.  Patient reports no bleeding, no contractions, no cramping, no leaking and occasional perineal pressure.  Contractions: Not present. Vag. Bleeding: None.  Movement: Present. Denies leaking of fluid.   The following portions of the patient's history were reviewed and updated as appropriate: allergies, current medications, past family history, past medical history, past social history, past surgical history and problem list. Problem list updated.  Objective:   Vitals:   07/06/19 1425  BP: 134/86  Pulse: (!) 104  Weight: 166 lb (75.3 kg)    Fetal Status: Fetal Heart Rate (bpm): 160 Fundal Height: 36 cm Movement: Present  Presentation: Vertex  General:  Alert, oriented and cooperative. Patient is in no acute distress.  Skin: Skin is warm and dry. No rash noted.   Cardiovascular: Normal heart rate noted  Respiratory: Normal respiratory effort, no problems with respiration noted  Abdomen: Soft, gravid, appropriate for gestational age.  Pain/Pressure: Present     Pelvic: Cervical exam performed        Extremities: Normal range of motion.  Edema: None  Mental Status: Normal mood and affect. Normal behavior. Normal judgment and thought content.   Assessment and Plan:  Pregnancy: G2P1001 at [redacted]w[redacted]d  1. Encounter for supervision of other normal pregnancy in third trimester - Continue routine care - Confirmed location of MAU for labor check - POC Urinalysis Dipstick OB  2. [redacted] weeks gestation of pregnancy  - GC/Chlamydia Probe Amp - Culture, beta strep (group b only) - POC Urinalysis Dipstick OB  Preterm labor symptoms and general obstetric precautions including but not limited to vaginal bleeding, contractions, leaking  of fluid and fetal movement were reviewed in detail with the patient. Please refer to After Visit Summary for other counseling recommendations.  Return in about 1 week (around 07/13/2019) for Virtual appointment.  No future appointments.  Darlina Rumpf, CNM

## 2019-07-07 ENCOUNTER — Inpatient Hospital Stay (HOSPITAL_COMMUNITY)
Admission: AD | Admit: 2019-07-07 | Discharge: 2019-07-07 | Disposition: A | Discharge status: Home / Self Care | Payer: Medicaid Other | Source: Home / Self Care | Attending: Family Medicine | Admitting: Family Medicine

## 2019-07-07 ENCOUNTER — Encounter (HOSPITAL_COMMUNITY): Payer: Self-pay | Admitting: *Deleted

## 2019-07-07 ENCOUNTER — Other Ambulatory Visit: Payer: Self-pay

## 2019-07-07 ENCOUNTER — Other Ambulatory Visit: Payer: Self-pay | Admitting: Women's Health

## 2019-07-07 ENCOUNTER — Telehealth: Payer: Self-pay | Admitting: Women's Health

## 2019-07-07 ENCOUNTER — Telehealth: Payer: Self-pay | Admitting: *Deleted

## 2019-07-07 DIAGNOSIS — Z3A36 36 weeks gestation of pregnancy: Secondary | ICD-10-CM | POA: Diagnosis not present

## 2019-07-07 DIAGNOSIS — O163 Unspecified maternal hypertension, third trimester: Secondary | ICD-10-CM

## 2019-07-07 DIAGNOSIS — R030 Elevated blood-pressure reading, without diagnosis of hypertension: Secondary | ICD-10-CM | POA: Diagnosis not present

## 2019-07-07 DIAGNOSIS — O133 Gestational [pregnancy-induced] hypertension without significant proteinuria, third trimester: Secondary | ICD-10-CM

## 2019-07-07 DIAGNOSIS — O4693 Antepartum hemorrhage, unspecified, third trimester: Secondary | ICD-10-CM | POA: Diagnosis present

## 2019-07-07 DIAGNOSIS — O479 False labor, unspecified: Secondary | ICD-10-CM

## 2019-07-07 DIAGNOSIS — O26893 Other specified pregnancy related conditions, third trimester: Secondary | ICD-10-CM | POA: Diagnosis not present

## 2019-07-07 DIAGNOSIS — Z3689 Encounter for other specified antenatal screening: Secondary | ICD-10-CM

## 2019-07-07 DIAGNOSIS — Z7722 Contact with and (suspected) exposure to environmental tobacco smoke (acute) (chronic): Secondary | ICD-10-CM | POA: Diagnosis not present

## 2019-07-07 LAB — CBC
HCT: 36.2 % (ref 36.0–46.0)
Hemoglobin: 12.7 g/dL (ref 12.0–15.0)
MCH: 31.1 pg (ref 26.0–34.0)
MCHC: 35.1 g/dL (ref 30.0–36.0)
MCV: 88.7 fL (ref 80.0–100.0)
Platelets: 224 10*3/uL (ref 150–400)
RBC: 4.08 MIL/uL (ref 3.87–5.11)
RDW: 12.4 % (ref 11.5–15.5)
WBC: 11.4 10*3/uL — ABNORMAL HIGH (ref 4.0–10.5)
nRBC: 0 % (ref 0.0–0.2)

## 2019-07-07 LAB — COMPREHENSIVE METABOLIC PANEL
ALT: 9 U/L (ref 0–44)
AST: 12 U/L — ABNORMAL LOW (ref 15–41)
Albumin: 3.1 g/dL — ABNORMAL LOW (ref 3.5–5.0)
Alkaline Phosphatase: 117 U/L (ref 38–126)
Anion gap: 11 (ref 5–15)
BUN: 6 mg/dL (ref 6–20)
CO2: 22 mmol/L (ref 22–32)
Calcium: 9.6 mg/dL (ref 8.9–10.3)
Chloride: 103 mmol/L (ref 98–111)
Creatinine, Ser: 0.61 mg/dL (ref 0.44–1.00)
GFR calc Af Amer: >60 mL/min (ref >60)
GFR calc non Af Amer: >60 mL/min (ref >60)
Glucose, Bld: 94 mg/dL (ref 70–99)
Potassium: 3.8 mmol/L (ref 3.5–5.1)
Sodium: 136 mmol/L (ref 135–145)
Total Bilirubin: 0.4 mg/dL (ref 0.3–1.2)
Total Protein: 6 g/dL — ABNORMAL LOW (ref 6.5–8.1)

## 2019-07-07 LAB — URINALYSIS, ROUTINE W REFLEX MICROSCOPIC
Bilirubin Urine: NEGATIVE
Glucose, UA: NEGATIVE mg/dL
Ketones, ur: 5 mg/dL — AB
Nitrite: NEGATIVE
Protein, ur: NEGATIVE mg/dL
Specific Gravity, Urine: 1.016 (ref 1.005–1.030)
pH: 7 (ref 5.0–8.0)

## 2019-07-07 LAB — PROTEIN / CREATININE RATIO, URINE
Creatinine, Urine: 87 mg/dL
Protein Creatinine Ratio: 0.09 mg/mg{Cre} (ref 0.00–0.15)
Total Protein, Urine: 8 mg/dL

## 2019-07-07 NOTE — MAU Provider Note (Addendum)
History     CSN: [id number]  Arrival date and time: 07/07/19 1303   First Provider Initiated Contact with Patient 07/07/19 1347      Chief Complaint  Patient presents with  . Vaginal Bleeding  . Hypertension   23 y.o. G2P1001 @36 .4 wks presenting with low abd pressure and spotting. She was seen earlier today for same sx. States the pressure has continued. Spotting is pink and on the toilet paper sometimes when she wipes. Denies LOF or ctx. +FM. She is concerned the pressure is a sign of labor because she labored fast with her first. Denies HA, visual disturbances, RUQ pain, SOB, and CP. Of note, she was found to have HTN on her previous visit today.   OB History    Gravida  2   Para  1   Term  1   Preterm      AB      Living  1     SAB      TAB      Ectopic      Multiple      Live Births  1           Past Medical History:  Diagnosis Date  . Medical history non-contributory     Past Surgical History:  Procedure Laterality Date  . TONSILLECTOMY AND ADENOIDECTOMY Bilateral 2003   Performed at [facility]    Family History  Problem Relation Age of Onset  . Depression Father   . Stroke Father   . Hypertension Father   . Heart disease Father   . Heart attack Maternal Grandfather   . Congestive Heart Failure Paternal Grandmother   . Migraines Paternal Grandmother   . COPD Paternal Grandmother   . Lung cancer Paternal Grandfather   . Autism Cousin        2 paternal first cousins have autism  . ADD / ADHD Cousin        Several paternal first cousins have ADHD/ADD    Social History   Tobacco Use  . Smoking status: Passive Smoke Exposure - Never Smoker  . Smokeless tobacco: Never Used  Substance Use Topics  . Alcohol use: No  . Drug use: No    Allergies:  Allergies  Allergen Reactions  . Other Hives and Rash    Glitter make-up causes rash and hives     Medications Prior to Admission  Medication Sig Dispense Refill Last  Dose  . meclizine (ANTIVERT) 12.5 MG tablet Take 1 tablet (12.5 mg total) by mouth 3 (three) times daily as needed for dizziness. 30 tablet 0 Past Month at Unknown time  . ondansetron (ZOFRAN ODT) 4 MG disintegrating tablet Take 1 tablet (4 mg total) by mouth every 6 (six) hours as needed for nausea. 30 tablet 2 Past Month at Unknown time  . prenatal vitamin w/FE, FA (PRENATAL 1 + 1) 27-1 MG TABS tablet Take 1 tablet by mouth daily at 12 noon. 30 each 12 07/07/2019 at Unknown time    Review of Systems  Eyes: Negative for visual disturbance.  Respiratory: Negative for shortness of breath.   Cardiovascular: Negative for chest pain.  Gastrointestinal: Positive for abdominal pain (pressure, low).  Genitourinary: Positive for vaginal bleeding. Negative for dysuria, hematuria and vaginal discharge.   Physical Exam   Blood pressure 130/83, pulse 94, temperature 98.3 F (36.8 C), resp. rate 16, last menstrual period 10/24/2018, SpO2 98 %, unknown if currently breastfeeding. Patient Vitals for the past 24 hrs:  BP Temp Pulse Resp SpO2  07/07/19 1345 130/83 - 94 - 98 %  07/07/19 1340 140/86 - 85 - 99 %  07/07/19 1327 (!) 147/95 98.3 F (36.8 C) 92 16 100 %    Physical Exam  Nursing note and vitals reviewed. Constitutional: She is oriented to person, place, and time. She appears well-developed and well-nourished. No distress.  HENT:  Head: Normocephalic and atraumatic.  Neck: Normal range of motion.  Cardiovascular: Normal rate.  Respiratory: Effort normal.  GI: Soft. She exhibits no distension. There is no abdominal tenderness.  gravid  Genitourinary:    Genitourinary Comments: External: no lesions or erythema Vagina: rugated, pink, moist, thin white discharge, NO blood or trace Cervix 3/50/-2, vtx    Musculoskeletal: Normal range of motion.  Neurological: She is alert and oriented to person, place, and time.  EFM: 150 bpm, mod variability, + accels, no decels Toco: irregular,  irritability  Results for orders placed or performed during the hospital encounter of 07/06/19 (from the past 24 hour(s))  Comprehensive metabolic panel     Status: Abnormal   Collection Time: 07/07/19 12:34 AM  Result Value Ref Range   Sodium 136 135 - 145 mmol/L   Potassium 3.8 3.5 - 5.1 mmol/L   Chloride 103 98 - 111 mmol/L   CO2 22 22 - 32 mmol/L   Glucose, Bld 94 70 - 99 mg/dL   BUN 6 6 - 20 mg/dL   Creatinine, Ser 7.00 0.44 - 1.00 mg/dL   Calcium 9.6 8.9 - 17.4 mg/dL   Total Protein 6.0 (L) 6.5 - 8.1 g/dL   Albumin 3.1 (L) 3.5 - 5.0 g/dL   AST 12 (L) 15 - 41 U/L   ALT 9 0 - 44 U/L   Alkaline Phosphatase 117 38 - 126 U/L   Total Bilirubin 0.4 0.3 - 1.2 mg/dL   GFR calc non Af Amer >60 >60 mL/min   GFR calc Af Amer >60 >60 mL/min   Anion gap 11 5 - 15  CBC     Status: Abnormal   Collection Time: 07/07/19 12:34 AM  Result Value Ref Range   WBC 11.4 (H) 4.0 - 10.5 K/uL   RBC 4.08 3.87 - 5.11 MIL/uL   Hemoglobin 12.7 12.0 - 15.0 g/dL   HCT 94.4 96.7 - 59.1 %   MCV 88.7 80.0 - 100.0 fL   MCH 31.1 26.0 - 34.0 pg   MCHC 35.1 30.0 - 36.0 g/dL   RDW 63.8 46.6 - 59.9 %   Platelets 224 150 - 400 K/uL   nRBC 0.0 0.0 - 0.2 %  Protein / creatinine ratio, urine     Status: None   Collection Time: 07/07/19 12:37 AM  Result Value Ref Range   Creatinine, Urine 87.00 mg/dL   Total Protein, Urine 8 mg/dL   Protein Creatinine Ratio 0.09 0.00 - 0.15 mg/mg[Cre]   MAU Course  Procedures  MDM No clinical findings of VB or labor (cervix unchanged), discussed comfort measures for pressure. PEC labs not repeated, pt asymptomatic. GHTN confirmed, discussed plan for IOL at 37 wks. Has appt at [facility] in 2 days for BP check and scheduled IOL. Discussed return precautions. Stable for discharge home.   Assessment and Plan   1. [redacted] weeks gestation of pregnancy   2. NST (non-stress test) reactive   3. Gestational hypertension, third trimester   4. False labor    Discharge home Follow up at  [facility] as scheduled PEC precautions PTL precautions  Allergies as of 07/07/2019      Reactions   Other Hives, Rash   Glitter make-up causes rash and hives       Medication List    TAKE these medications   meclizine 12.5 MG tablet Commonly known as: ANTIVERT Take 1 tablet (12.5 mg total) by mouth 3 (three) times daily as needed for dizziness.   ondansetron 4 MG disintegrating tablet Commonly known as: Zofran ODT Take 1 tablet (4 mg total) by mouth every 6 (six) hours as needed for nausea.   prenatal vitamin w/FE, FA 27-1 MG Tabs tablet Take 1 tablet by mouth daily at 12 noon.      Donette LarryMelanie Dacen Frayre, CNM 07/07/2019, 1:56 PM

## 2019-07-07 NOTE — MAU Provider Note (Addendum)
History     CSN: [id number]  Arrival date and time: 07/06/19 2321   First Provider Initiated Contact with Patient 07/07/19 0019      No chief complaint on file.   Jamie Maynard is a 23 y.o. G2P1001 at [redacted]w[redacted]d who receives care at [facility].  She presents today for pressure and vaginal bleeding.  Patient reports she had some "spotty bleeding" earlier that was dark pink in color.  She states she only noticed the spotting with wiping and denies cramping, contractions, or recent sexual activity.  She reports that she had a vaginal exam in the office, today, and was closed.  Patient endorses fetal movement and reports that the pelvic pressure has been present for weeks.  She states that it is uncomfortable, but not necessarily painful.  Patient denies HA, visual disturbances, and SOB.      OB History    Gravida  2   Para  1   Term  1   Preterm      AB      Living  1     SAB      TAB      Ectopic      Multiple      Live Births  1           Past Medical History:  Diagnosis Date  . Medical history non-contributory     Past Surgical History:  Procedure Laterality Date  . TONSILLECTOMY AND ADENOIDECTOMY Bilateral 2003   Performed at [facility]    Family History  Problem Relation Age of Onset  . Depression Father   . Stroke Father   . Hypertension Father   . Heart disease Father   . Heart attack Maternal Grandfather   . Congestive Heart Failure Paternal Grandmother   . Migraines Paternal Grandmother   . COPD Paternal Grandmother   . Lung cancer Paternal Grandfather   . Autism Cousin        2 paternal first cousins have autism  . ADD / ADHD Cousin        Several paternal first cousins have ADHD/ADD    Social History   Tobacco Use  . Smoking status: Passive Smoke Exposure - Never Smoker  . Smokeless tobacco: Never Used  Substance Use Topics  . Alcohol use: No  . Drug use: No    Allergies:  Allergies  Allergen Reactions   . Other Hives and Rash    Glitter make-up causes rash and hives     Medications Prior to Admission  Medication Sig Dispense Refill Last Dose  . ondansetron (ZOFRAN ODT) 4 MG disintegrating tablet Take 1 tablet (4 mg total) by mouth every 6 (six) hours as needed for nausea. 30 tablet 2 Past Week at Unknown time  . prenatal vitamin w/FE, FA (PRENATAL 1 + 1) 27-1 MG TABS tablet Take 1 tablet by mouth daily at 12 noon. 30 each 12 07/06/2019 at Unknown time  . meclizine (ANTIVERT) 12.5 MG tablet Take 1 tablet (12.5 mg total) by mouth 3 (three) times daily as needed for dizziness. 30 tablet 0     Review of Systems  Constitutional: Negative for chills and fever.  Eyes: Negative for visual disturbance.  Respiratory: Negative for cough and shortness of breath.   Gastrointestinal: Negative for abdominal pain, constipation, diarrhea, nausea and vomiting.  Genitourinary: Positive for pelvic pain (Pressure) and vaginal bleeding. Negative for difficulty urinating, dysuria and vaginal discharge.  Neurological: Negative for dizziness, light-headedness and numbness.  Physical Exam   Blood pressure (!) 129/91, pulse 96, temperature 98.6 F (37 C), resp. rate 19, height 5\' 7"  (1.702 m), weight 75.7 kg, last menstrual period 10/24/2018, SpO2 95 %, unknown if currently breastfeeding.   Vitals:   07/07/19 0045 07/07/19 0100 07/07/19 0115 07/07/19 0132  BP: (!) 133/98 (!) 137/97 132/87 (!) 136/93  Pulse: 93 94 92 95  Resp:      Temp:      SpO2:      Weight:      Height:         Physical Exam  Constitutional: She is oriented to person, place, and time. She appears well-developed and well-nourished. No distress.  HENT:  Head: Normocephalic and atraumatic.  Eyes: Conjunctivae are normal.  Neck: Normal range of motion.  Cardiovascular: Normal rate.  Respiratory: Effort normal.  GI: Soft. There is no abdominal tenderness.  Musculoskeletal: Normal range of motion.  Neurological: She is alert and  oriented to person, place, and time.  Skin: Skin is warm and dry.  Psychiatric: She has a normal mood and affect. Her behavior is normal.   Dilation: 3 Effacement (%): 50 Cervical Position: Posterior Station: Ballotable Presentation: Vertex Exam by:: J.Kynnadi Dicenso, CNM   Fetal Assessment 150 bpm, Mod Var, -Decels, +Accels Toco: Q75min  MAU Course   Results for orders placed or performed during the hospital encounter of 07/06/19 (from the past 24 hour(s))  Comprehensive metabolic panel     Status: Abnormal   Collection Time: 07/07/19 12:34 AM  Result Value Ref Range   Sodium 136 135 - 145 mmol/L   Potassium 3.8 3.5 - 5.1 mmol/L   Chloride 103 98 - 111 mmol/L   CO2 22 22 - 32 mmol/L   Glucose, Bld 94 70 - 99 mg/dL   BUN 6 6 - 20 mg/dL   Creatinine, Ser 6.71 0.44 - 1.00 mg/dL   Calcium 9.6 8.9 - 24.5 mg/dL   Total Protein 6.0 (L) 6.5 - 8.1 g/dL   Albumin 3.1 (L) 3.5 - 5.0 g/dL   AST 12 (L) 15 - 41 U/L   ALT 9 0 - 44 U/L   Alkaline Phosphatase 117 38 - 126 U/L   Total Bilirubin 0.4 0.3 - 1.2 mg/dL   GFR calc non Af Amer >60 >60 mL/min   GFR calc Af Amer >60 >60 mL/min   Anion gap 11 5 - 15  CBC     Status: Abnormal   Collection Time: 07/07/19 12:34 AM  Result Value Ref Range   WBC 11.4 (H) 4.0 - 10.5 K/uL   RBC 4.08 3.87 - 5.11 MIL/uL   Hemoglobin 12.7 12.0 - 15.0 g/dL   HCT 80.9 98.3 - 38.2 %   MCV 88.7 80.0 - 100.0 fL   MCH 31.1 26.0 - 34.0 pg   MCHC 35.1 30.0 - 36.0 g/dL   RDW 50.5 39.7 - 67.3 %   Platelets 224 150 - 400 K/uL   nRBC 0.0 0.0 - 0.2 %  Protein / creatinine ratio, urine     Status: None   Collection Time: 07/07/19 12:37 AM  Result Value Ref Range   Creatinine, Urine 87.00 mg/dL   Total Protein, Urine 8 mg/dL   Protein Creatinine Ratio 0.09 0.00 - 0.15 mg/mg[Cre]   No results found.  MDM PE Labs:Physical Exam Labs: CBC, CMP, PC Ratio Measure BPQ15 min EFM Assessment and Plan  23 year old G2P1001  SIUP at 36.4weeks Cat I FT Vaginal  Bleeding-Resolved Elevated BP  -  Exam findings discussed. -Informed that no cultures would be collected since they were completed this morning. -Patient tearful with exam results and reassured that current dilation does not signify labor, just cervical change since her last exam. -Discussed elevated bp and need for assessment. -Labs ordered and collected. -Will continue to monitor -NST Reactive   Cherre RobinsJessica L Arnett Duddy MSN, CNM 07/07/2019, 12:19 AM   Reassessment (1:45 AM) Transient Hypertension  -Labs return normal as above. -In room to discuss results. -Patient reports minor increase in pelvic pressure. -Cervical exam remains unchanged. -Informed of need for follow up appt in office on Thursday with scheduling of IOL if elevated bp noted. -Discussed PreEclampsia signs/symptoms.  -Labor Precautions Given. -No questions or concerns. -Message sent to FT staff to schedule patient accordingly.  -Encouraged to call or return to MAU if symptoms worsen or with the onset of new symptoms. -Discharged to home in stable condition.  Cherre RobinsJessica L Sheresa Cullop MSN, CNM

## 2019-07-07 NOTE — Telephone Encounter (Signed)
Pt said she went back to [facility] and they told her to call us and she needed to have an induction for Thursday I seen where she was coming in for BP ck please advise

## 2019-07-07 NOTE — Progress Notes (Signed)
Induction Assessment Scheduling Form Fax to [facility]:  [phone]  Jamie Maynard                                                                                   DOB:  1996/02/15                                                            MRN:  [id number]                                                                     Phone #:    [phone] (Mobile)                     Provider:  [facility]  GP:  [id number]                                                            Estimated Date of Delivery: 07/31/19  Dating Criteria: LMP c/w 10wk u/s    Medical Indications for induction:  GHTN Admission Date/Time:  9/4 @ 08000 Gestational age on admission:  37.0   There were no vitals filed for this visit. HIV:  Non Reactive (06/18 0916) GBS:  pending  3/50/-2   Method of induction(proposed):  pit   Scheduling Provider Signature:  Roma Schanz, CNM                                            Today's Date:  07/07/2019

## 2019-07-07 NOTE — Discharge Instructions (Signed)
Hypertension During Pregnancy °High blood pressure (hypertension) is when the force of blood pumping through the arteries is too strong. Arteries are blood vessels that carry blood from the heart throughout the body. Hypertension during pregnancy can be mild or severe. Severe hypertension during pregnancy (preeclampsia) is a medical emergency that requires prompt evaluation and treatment. °Different types of hypertension can happen during pregnancy. These include: °· Chronic hypertension. This happens when you had high blood pressure before you became pregnant, and it continues during the pregnancy. Hypertension that develops before you are [redacted] weeks pregnant and continues during the pregnancy is also called chronic hypertension. If you have chronic hypertension, it will not go away after you have your baby. You will need follow-up visits with your health care provider after you have your baby. Your doctor may want you to keep taking medicine for your blood pressure. °· Gestational hypertension. This is hypertension that develops after the 20th week of pregnancy. Gestational hypertension usually goes away after you have your baby, but your health care provider will need to monitor your blood pressure to make sure that it is getting better. °· Preeclampsia. This is severe hypertension during pregnancy. This can cause serious complications for you and your baby and can also cause complications for you after the delivery of your baby. °· Postpartum preeclampsia. You may develop severe hypertension after giving birth. This usually occurs within 48 hours after childbirth but may occur up to 6 weeks after giving birth. This is rare. °How does this affect me? °Women who have hypertension during pregnancy have a greater chance of developing hypertension later in life or during future pregnancies. In some cases, hypertension during pregnancy can cause serious complications, such as: °· Stroke. °· Heart attack. °· Injury to  other organs, such as kidneys, lungs, or liver. °· Preeclampsia. °· Convulsions or seizures. °· Placental abruption. °How does this affect my baby? °Hypertension during pregnancy can affect your baby. Your baby may: °· Be born early (prematurely). °· Not weigh as much as he or she should at birth (low birth weight). °· Not tolerate labor well, leading to an unplanned cesarean delivery. °What are the risks? °There are certain factors that make it more likely for you to develop hypertension during pregnancy. These include: °· Having hypertension during a previous pregnancy. °· Being overweight. °· Being age 35 or older. °· Being pregnant for the first time. °· Being pregnant with more than one baby. °· Becoming pregnant using fertilization methods, such as IVF (in vitro fertilization). °· Having other medical problems, such as diabetes, kidney disease, or lupus. °· Having a family history of hypertension. °What can I do to lower my risk? °The exact cause of hypertension during pregnancy is not known. You may be able to lower your risk by: °· Maintaining a healthy weight. °· Eating a healthy and balanced diet. °· Following your health care provider's instructions about treating any long-term conditions that you had before becoming pregnant. °It is very important to keep all of your prenatal care appointments. Your health care provider will check your blood pressure and make sure that your pregnancy is progressing as expected. If a problem is found, early treatment can prevent complications. °How is this treated? °Treatment for hypertension during pregnancy varies depending on the type of hypertension you have and how serious it is. °· If you were taking medicine for high blood pressure before you became pregnant, talk with your health care provider. You may need to change medicine during pregnancy because   some medicines, like ACE inhibitors, may not be considered safe for your baby. °· If you have gestational  hypertension, your health care provider may order medicine to treat this during pregnancy. °· If you are at risk for preeclampsia, your health care provider may recommend that you take a low-dose aspirin during your pregnancy. °· If you have severe hypertension, you may need to be hospitalized so you and your baby can be monitored closely. You may also need to be given medicine to lower your blood pressure. This medicine may be given by mouth or through an IV. °· In some cases, if your condition gets worse, you may need to deliver your baby early. °Follow these instructions at home: °Eating and drinking ° °· Drink enough fluid to keep your urine pale yellow. °· Avoid caffeine. °Lifestyle °· Do not use any products that contain nicotine or tobacco, such as cigarettes, e-cigarettes, and chewing tobacco. If you need help quitting, ask your health care provider. °· Do not use alcohol or drugs. °· Avoid stress as much as possible. °· Rest and get plenty of sleep. °· Regular exercise can help to reduce your blood pressure. Ask your health care provider what kinds of exercise are best for you. °General instructions °· Take over-the-counter and prescription medicines only as told by your health care provider. °· Keep all prenatal and follow-up visits as told by your health care provider. This is important. °Contact a health care provider if: °· You have symptoms that your health care provider told you may require more treatment or monitoring, such as: °? Headaches. °? Nausea or vomiting. °? Abdominal pain. °? Dizziness. °? Light-headedness. °Get help right away if: °· You have: °? Severe abdominal pain that does not get better with treatment. °? A severe headache that does not get better. °? Vomiting that does not get better. °? Sudden, rapid weight gain. °? Sudden swelling in your hands, ankles, or face. °? Vaginal bleeding. °? Blood in your urine. °? Blurred or double vision. °? Shortness of breath or chest  pain. °? Weakness on one side of your body. °? Difficulty speaking. °· Your baby is not moving as much as usual. °Summary °· High blood pressure (hypertension) is when the force of blood pumping through the arteries is too strong. °· Hypertension during pregnancy can cause problems for you and your baby. °· Treatment for hypertension during pregnancy varies depending on the type of hypertension you have and how serious it is. °· Keep all prenatal and follow-up visits as told by your health care provider. This is important. °This information is not intended to replace advice given to you by your health care provider. Make sure you discuss any questions you have with your health care provider. °Document Released: 07/10/2011 Document Revised: 02/12/2019 Document Reviewed: 11/18/2018 °Elsevier Patient Education © 2020 [employer] ° °

## 2019-07-07 NOTE — Telephone Encounter (Signed)
Patient called and informed I have discussed with [facility] and have set up induction for her for Friday at 8am.  She does not need to come to her appt on Thursday but needs to continue to monitor her blood pressure 4 times daily and if she develops severe range 160's/110, headaches or visual disturbances then she needs to [facility] to [facility]. Pt verbalized understanding with no further questions.

## 2019-07-07 NOTE — Progress Notes (Signed)
Pt called office and spoke w/ Tish, Therapist, sports. Dx w/ GHTN in [facility]U today @ [redacted]w[redacted]d, IOL scheduled for 9/4 @ 0800 @ 37.0wks,  IOL form faxed via Epic and orders placed. Roma Schanz, CNM, [facility] 07/07/2019 4:24 PM

## 2019-07-07 NOTE — Discharge Instructions (Signed)
Braxton Hicks Contractions °Contractions of the uterus can occur throughout pregnancy, but they are not always a sign that you are in labor. You may have practice contractions called Braxton Hicks contractions. These false labor contractions are sometimes confused with true labor. °What are Braxton Hicks contractions? °Braxton Hicks contractions are tightening movements that occur in the muscles of the uterus before labor. Unlike true labor contractions, these contractions do not result in opening (dilation) and thinning of the cervix. Toward the end of pregnancy (32-34 weeks), Braxton Hicks contractions can happen more often and may become stronger. These contractions are sometimes difficult to tell apart from true labor because they can be very uncomfortable. You should not feel embarrassed if you go to the hospital with false labor. °Sometimes, the only way to tell if you are in true labor is for your health care provider to look for changes in the cervix. The health care provider will do a physical exam and may monitor your contractions. If you are not in true labor, the exam should show that your cervix is not dilating and your water has not broken. °If there are no other health problems associated with your pregnancy, it is completely safe for you to be sent home with false labor. You may continue to have Braxton Hicks contractions until you go into true labor. °How to tell the difference between true labor and false labor °True labor °· Contractions last 30-70 seconds. °· Contractions become very regular. °· Discomfort is usually felt in the top of the uterus, and it spreads to the lower abdomen and low back. °· Contractions do not go away with walking. °· Contractions usually become more intense and increase in frequency. °· The cervix dilates and gets thinner. °False labor °· Contractions are usually shorter and not as strong as true labor contractions. °· Contractions are usually irregular. °· Contractions  are often felt in the front of the lower abdomen and in the groin. °· Contractions may go away when you walk around or change positions while lying down. °· Contractions get weaker and are shorter-lasting as time goes on. °· The cervix usually does not dilate or become thin. °Follow these instructions at home: ° °· Take over-the-counter and prescription medicines only as told by your health care provider. °· Keep up with your usual exercises and follow other instructions from your health care provider. °· Eat and drink lightly if you think you are going into labor. °· If Braxton Hicks contractions are making you uncomfortable: °? Change your position from lying down or resting to walking, or change from walking to resting. °? Sit and rest in a tub of warm water. °? Drink enough fluid to keep your urine pale yellow. Dehydration may cause these contractions. °? Do slow and deep breathing several times an hour. °· Keep all follow-up prenatal visits as told by your health care provider. This is important. °Contact a health care provider if: °· You have a fever. °· You have continuous pain in your abdomen. °Get help right away if: °· Your contractions become stronger, more regular, and closer together. °· You have fluid leaking or gushing from your vagina. °· You pass blood-tinged mucus (bloody show). °· You have bleeding from your vagina. °· You have low back pain that you never had before. °· You feel your baby’s head pushing down and causing pelvic pressure. °· Your baby is not moving inside you as much as it used to. °Summary °· Contractions that occur before labor are   called Braxton Hicks contractions, false labor, or practice contractions.  Braxton Hicks contractions are usually shorter, weaker, farther apart, and less regular than true labor contractions. True labor contractions usually become progressively stronger and regular, and they become more frequent.  Manage discomfort from [facility] LP[facility] contractions  by changing position, resting in a warm bath, drinking plenty of water, or practicing deep breathing. This information is not intended to replace advice given to you by your health care provider. Make sure you discuss any questions you have with your health care provider. Document Released: 03/07/2017 Document Revised: 10/04/2017 Document Reviewed: 03/07/2017 Elsevier Patient Education  2020 [employer]   Hypertension During Pregnancy Hypertension is also called high blood pressure. High blood pressure means that the force of your blood moving in your body is too strong. It can cause problems for you and your baby. Different types of high blood pressure can happen during pregnancy. The types are:  High blood pressure before you got pregnant. This is called chronic hypertension.  This can continue during your pregnancy. Your doctor will want to keep checking your blood pressure. You may need medicine to keep your blood pressure under control while you are pregnant. You will need follow-up visits after you have your baby.  High blood pressure that goes up during pregnancy when it was normal before. This is called gestational hypertension. It will usually get better after you have your baby, but your doctor will need to watch your blood pressure to make sure that it is getting better.  Very high blood pressure during pregnancy. This is called preeclampsia. Very high blood pressure is an emergency that needs to be checked and treated right away.  You may develop very high blood pressure after giving birth. This is called postpartum preeclampsia. This usually occurs within 48 hours after childbirth but may occur up to 6 weeks after giving birth. This is rare. How does this affect me? If you have high blood pressure during pregnancy, you have a higher chance of developing high blood pressure:  As you get older.  If you get pregnant again. In some cases, high blood pressure during pregnancy can  cause:  Stroke.  Heart attack.  Damage to the kidneys, lungs, or liver.  Preeclampsia.  Jerky movements you cannot control (convulsions or seizures).  Problems with the placenta. How does this affect my baby? Your baby may:  Be born early.  Not weigh as much as he or she should.  Not handle labor well, leading to a c-section birth. What are the risks?  Having high blood pressure during a past pregnancy.  Being overweight.  Being 21 years old or older.  Being pregnant for the first time.  Being pregnant with more than one baby.  Becoming pregnant using fertility methods, such as IVF.  Having other problems, such as diabetes, or kidney disease.  Having family members who have high blood pressure. What can I do to lower my risk?   Keep a healthy weight.  Eat a healthy diet.  Follow what your doctor tells you about treating any medical problems that you had before becoming pregnant. It is very important to go to all of your doctor visits. Your doctor will check your blood pressure and make sure that your pregnancy is progressing as it should. Treatment should start early if a problem is found. How is this treated? Treatment for high blood pressure during pregnancy can differ depending on the type of high blood pressure you have and how  serious it is.  You may need to take blood pressure medicine.  If you have been taking medicine for your blood pressure, you may need to change the medicine during pregnancy if it is not safe for your baby.  If your doctor thinks that you could get very high blood pressure, he or she may tell you to take a low-dose aspirin during your pregnancy.  If you have very high blood pressure, you may need to stay in the hospital so you and your baby can be watched closely. You may also need to take medicine to lower your blood pressure. This medicine may be given by mouth or through an IV tube.  In some cases, if your condition gets worse,  you may need to have your baby early. Follow these instructions at home: Eating and drinking   Drink enough fluid to keep your pee (urine) pale yellow.  Avoid caffeine. Lifestyle  Do not use any products that contain nicotine or tobacco, such as cigarettes, e-cigarettes, and chewing tobacco. If you need help quitting, ask your doctor.  Do not use alcohol or drugs.  Avoid stress.  Rest and get plenty of sleep.  Regular exercise can help. Ask your doctor what kinds of exercise are best for you. General instructions  Take over-the-counter and prescription medicines only as told by your doctor.  Keep all prenatal and follow-up visits as told by your doctor. This is important. Contact a doctor if:  You have symptoms that your doctor told you to watch for, such as: ? Headaches. ? Nausea. ? Vomiting. ? Belly (abdominal) pain. ? Dizziness. ? Light-headedness. Get help right away if:  You have: ? Very bad belly pain that does not get better with treatment. ? A very bad headache that does not get better. ? Vomiting that does not get better. ? Sudden, fast weight gain. ? Sudden swelling in your hands, ankles, or face. ? Bleeding from your vagina. ? Blood in your pee. ? Blurry vision. ? Double vision. ? Shortness of breath. ? Chest pain. ? Weakness on one side of your body. ? Trouble talking.  Your baby is not moving as much as usual. Summary  High blood pressure is also called hypertension.  High blood pressure means that the force of your blood moving in your body is too strong.  High blood pressure can cause problems for you and your baby.  Keep all follow-up visits as told by your doctor. This is important. This information is not intended to replace advice given to you by your health care provider. Make sure you discuss any questions you have with your health care provider. Document Released: 11/24/2010 Document Revised: 02/12/2019 Document Reviewed:  11/18/2018 Elsevier Patient Education  2020 [employer].

## 2019-07-07 NOTE — MAU Note (Signed)
.   Jamie Maynard is a 24 y.o. at [redacted]w[redacted]d here in MAU reporting: vaginal bleeding when she wipes and high blood pressure. PT states that she was evaluated last night in MAU for the same issues and told to come back in if the bleeding continued and increase in blood pressure  Onset of complaint: yesterday Pain score: 4 Vitals:   07/07/19 1327  BP: (!) 147/95  Pulse: 92  Resp: 16  Temp: 98.3 F (36.8 C)  SpO2: 100%     FHT:135 Lab orders placed from triage:

## 2019-07-08 ENCOUNTER — Other Ambulatory Visit: Payer: Self-pay

## 2019-07-08 ENCOUNTER — Inpatient Hospital Stay (HOSPITAL_COMMUNITY)
Admission: AD | Admit: 2019-07-08 | Discharge: 2019-07-08 | Disposition: A | Discharge status: Home / Self Care | Payer: Medicaid Other | Attending: Family Medicine | Admitting: Family Medicine

## 2019-07-08 DIAGNOSIS — O133 Gestational [pregnancy-induced] hypertension without significant proteinuria, third trimester: Secondary | ICD-10-CM

## 2019-07-08 DIAGNOSIS — O26893 Other specified pregnancy related conditions, third trimester: Secondary | ICD-10-CM | POA: Diagnosis not present

## 2019-07-08 DIAGNOSIS — N898 Other specified noninflammatory disorders of vagina: Secondary | ICD-10-CM

## 2019-07-08 DIAGNOSIS — Z3A36 36 weeks gestation of pregnancy: Secondary | ICD-10-CM | POA: Diagnosis not present

## 2019-07-08 LAB — POCT FERN TEST: POCT Fern Test: NEGATIVE

## 2019-07-08 LAB — GC/CHLAMYDIA PROBE AMP
Chlamydia trachomatis, NAA: NEGATIVE
Neisseria Gonorrhoeae by PCR: NEGATIVE

## 2019-07-08 LAB — SARS CORONAVIRUS 2 BY RT PCR (HOSPITAL ORDER, PERFORMED IN ~~LOC~~ HOSPITAL LAB): SARS Coronavirus 2: NEGATIVE

## 2019-07-08 NOTE — MAU Provider Note (Signed)
First Provider Initiated Contact with Patient 07/08/19 1542      S: Ms. Jamie Maynard is a 23 y.o. G2P1001 at [redacted]w[redacted]d  who presents to MAU today complaining of leaking of fluid since 1100. She denies vaginal bleeding. She endorses contractions, irregular. She reports normal fetal movement.    O: BP 122/90   Pulse (!) 124   Temp 98.5 F (36.9 C)   Resp 16   Wt 75 kg   LMP 10/24/2018 (Exact Date)   SpO2 100%   BMI 25.90 kg/m  GENERAL: Well-developed, well-nourished female in no acute distress.  HEAD: Normocephalic, atraumatic.  CHEST: Normal effort of breathing, regular heart rate ABDOMEN: Soft, nontender, gravid PELVIC: Normal external female genitalia. Vagina is pink and rugated. Cervix with normal contour, no lesions. Normal discharge.  negative pooling.   Cervical exam:  Dilation: 3 Effacement (%): 50 Cervical Position: Posterior Station: -3 Exam by:: Kerry Hough, PA-C   Fetal Monitoring: Baseline: 140 bpm Variability: moderate Accelerations: 15 x 15 Decelerations: none Contractions: q 3-5 minutes, mild   Results for orders placed or performed during the hospital encounter of 07/08/19 (from the past 24 hour(s))  Fern Test     Status: Normal   Collection Time: 07/08/19  3:04 PM  Result Value Ref Range   POCT Fern Test Negative = intact amniotic membranes    Patient Vitals for the past 24 hrs:  BP Temp Pulse Resp SpO2 Weight  07/08/19 1515 122/90 - - - - -  07/08/19 1502 123/88 - - - - -  07/08/19 1438 130/87 98.5 F (36.9 C) (!) 124 16 100 % 75 kg     A: [facility] at [redacted]w[redacted]d  Membranes intact Gestational HTN previously diagnosed   P: Discharge home Warning signs for worsening condition discussed Patient advised to follow-up for IOL as scheduled on Friday or call [facility] sooner if symptoms change or worsen Patient may return to MAU as needed or if her condition were to change or worsen   Danielle Rankin 07/08/2019 3:43 PM

## 2019-07-08 NOTE — MAU Note (Signed)
.   Jamie Maynard is a 23 y.o. at [redacted]w[redacted]d here in MAU reporting: complaints of leakage of fluid since 1140 this morning with lower abdominal cramping. Pt denies any vaginal bleeding, mild abdominal cramping  Onset of complaint: today @1140  Pain score: 3 Vitals:   07/08/19 1438  BP: 130/87  Pulse: (!) 124  Resp: 16  Temp: 98.5 F (36.9 C)  SpO2: 100%     FHT:160 Lab orders placed from triage: [facility]

## 2019-07-08 NOTE — Discharge Instructions (Signed)
Fetal Movement Counts °Patient Name: ________________________________________________ Patient Due Date: ____________________ °What is a fetal movement count? ° °A fetal movement count is the number of times that you feel your baby move during a certain amount of time. This may also be called a fetal kick count. A fetal movement count is recommended for every pregnant woman. You may be asked to start counting fetal movements as early as week 28 of your pregnancy. °Pay attention to when your baby is most active. You may notice your baby's sleep and wake cycles. You may also notice things that make your baby move more. You should do a fetal movement count: °· When your baby is normally most active. °· At the same time each day. °A good time to count movements is while you are resting, after having something to eat and drink. °How do I count fetal movements? °1. Find a quiet, comfortable area. Sit, or lie down on your side. °2. Write down the date, the start time and stop time, and the number of movements that you felt between those two times. Take this information with you to your health care visits. °3. For 2 hours, count kicks, flutters, swishes, rolls, and jabs. You should feel at least 10 movements during 2 hours. °4. You may stop counting after you have felt 10 movements. °5. If you do not feel 10 movements in 2 hours, have something to eat and drink. Then, keep resting and counting for 1 hour. If you feel at least 4 movements during that hour, you may stop counting. °Contact a health care provider if: °· You feel fewer than 4 movements in 2 hours. °· Your baby is not moving like he or she usually does. °Date: ____________ Start time: ____________ Stop time: ____________ Movements: ____________ °Date: ____________ Start time: ____________ Stop time: ____________ Movements: ____________ °Date: ____________ Start time: ____________ Stop time: ____________ Movements: ____________ °Date: ____________ Start time:  ____________ Stop time: ____________ Movements: ____________ °Date: ____________ Start time: ____________ Stop time: ____________ Movements: ____________ °Date: ____________ Start time: ____________ Stop time: ____________ Movements: ____________ °Date: ____________ Start time: ____________ Stop time: ____________ Movements: ____________ °Date: ____________ Start time: ____________ Stop time: ____________ Movements: ____________ °Date: ____________ Start time: ____________ Stop time: ____________ Movements: ____________ °This information is not intended to replace advice given to you by your health care provider. Make sure you discuss any questions you have with your health care provider. °Document Released: 11/21/2006 Document Revised: 11/11/2018 Document Reviewed: 12/01/2015 °Elsevier Patient Education © 2020 Elsevier Inc. °Braxton Hicks Contractions °Contractions of the uterus can occur throughout pregnancy, but they are not always a sign that you are in labor. You may have practice contractions called Braxton Hicks contractions. These false labor contractions are sometimes confused with true labor. °What are Braxton Hicks contractions? °Braxton Hicks contractions are tightening movements that occur in the muscles of the uterus before labor. Unlike true labor contractions, these contractions do not result in opening (dilation) and thinning of the cervix. Toward the end of pregnancy (32-34 weeks), Braxton Hicks contractions can happen more often and may become stronger. These contractions are sometimes difficult to tell apart from true labor because they can be very uncomfortable. You should not feel embarrassed if you go to the hospital with false labor. °Sometimes, the only way to tell if you are in true labor is for your health care provider to look for changes in the cervix. The health care provider will do a physical exam and may monitor your contractions. If you   are not in true labor, the exam should show  that your cervix is not dilating and your water has not broken. If there are no other health problems associated with your pregnancy, it is completely safe for you to be sent home with false labor. You may continue to have Braxton Hicks contractions until you go into true labor. How to tell the difference between true labor and false labor True labor  Contractions last 30-70 seconds.  Contractions become very regular.  Discomfort is usually felt in the top of the uterus, and it spreads to the lower abdomen and low back.  Contractions do not go away with walking.  Contractions usually become more intense and increase in frequency.  The cervix dilates and gets thinner. False labor  Contractions are usually shorter and not as strong as true labor contractions.  Contractions are usually irregular.  Contractions are often felt in the front of the lower abdomen and in the groin.  Contractions may go away when you walk around or change positions while lying down.  Contractions get weaker and are shorter-lasting as time goes on.  The cervix usually does not dilate or become thin. Follow these instructions at home:   Take over-the-counter and prescription medicines only as told by your health care provider.  Keep up with your usual exercises and follow other instructions from your health care provider.  Eat and drink lightly if you think you are going into labor.  If Braxton Hicks contractions are making you uncomfortable: ? Change your position from lying down or resting to walking, or change from walking to resting. ? Sit and rest in a tub of warm water. ? Drink enough fluid to keep your urine pale yellow. Dehydration may cause these contractions. ? Do slow and deep breathing several times an hour.  Keep all follow-up prenatal visits as told by your health care provider. This is important. Contact a health care provider if:  You have a fever.  You have continuous pain in  your abdomen. Get help right away if:  Your contractions become stronger, more regular, and closer together.  You have fluid leaking or gushing from your vagina.  You pass blood-tinged mucus (bloody show).  You have bleeding from your vagina.  You have low back pain that you never had before.  You feel your babys head pushing down and causing pelvic pressure.  Your baby is not moving inside you as much as it used to. Summary  Contractions that occur before labor are called Braxton Hicks contractions, false labor, or practice contractions.  Braxton Hicks contractions are usually shorter, weaker, farther apart, and less regular than true labor contractions. True labor contractions usually become progressively stronger and regular, and they become more frequent.  Manage discomfort from [facility]Braxton Hicks contractions by changing position, resting in a warm bath, drinking plenty of water, or practicing deep breathing. This information is not intended to replace advice given to you by your health care provider. Make sure you discuss any questions you have with your health care provider. Document Released: 03/07/2017 Document Revised: 10/04/2017 Document Reviewed: 03/07/2017 Elsevier Patient Education  2020 [employer] Hypertension During Pregnancy Hypertension is also called high blood pressure. High blood pressure means that the force of your blood moving in your body is too strong. It can cause problems for you and your baby. Different types of high blood pressure can happen during pregnancy. The types are:  High blood pressure before you got pregnant. This is called chronic  hypertension.  This can continue during your pregnancy. Your doctor will want to keep checking your blood pressure. You may need medicine to keep your blood pressure under control while you are pregnant. You will need follow-up visits after you have your baby.  High blood pressure that goes up during pregnancy when it  was normal before. This is called gestational hypertension. It will usually get better after you have your baby, but your doctor will need to watch your blood pressure to make sure that it is getting better.  Very high blood pressure during pregnancy. This is called preeclampsia. Very high blood pressure is an emergency that needs to be checked and treated right away.  You may develop very high blood pressure after giving birth. This is called postpartum preeclampsia. This usually occurs within 48 hours after childbirth but may occur up to 6 weeks after giving birth. This is rare. How does this affect me? If you have high blood pressure during pregnancy, you have a higher chance of developing high blood pressure:  As you get older.  If you get pregnant again. In some cases, high blood pressure during pregnancy can cause:  Stroke.  Heart attack.  Damage to the kidneys, lungs, or liver.  Preeclampsia.  Jerky movements you cannot control (convulsions or seizures).  Problems with the placenta. How does this affect my baby? Your baby may:  Be born early.  Not weigh as much as he or she should.  Not handle labor well, leading to a c-section birth. What are the risks?  Having high blood pressure during a past pregnancy.  Being overweight.  Being 28 years old or older.  Being pregnant for the first time.  Being pregnant with more than one baby.  Becoming pregnant using fertility methods, such as IVF.  Having other problems, such as diabetes, or kidney disease.  Having family members who have high blood pressure. What can I do to lower my risk?   Keep a healthy weight.  Eat a healthy diet.  Follow what your doctor tells you about treating any medical problems that you had before becoming pregnant. It is very important to go to all of your doctor visits. Your doctor will check your blood pressure and make sure that your pregnancy is progressing as it should. Treatment  should start early if a problem is found. How is this treated? Treatment for high blood pressure during pregnancy can differ depending on the type of high blood pressure you have and how serious it is.  You may need to take blood pressure medicine.  If you have been taking medicine for your blood pressure, you may need to change the medicine during pregnancy if it is not safe for your baby.  If your doctor thinks that you could get very high blood pressure, he or she may tell you to take a low-dose aspirin during your pregnancy.  If you have very high blood pressure, you may need to stay in the hospital so you and your baby can be watched closely. You may also need to take medicine to lower your blood pressure. This medicine may be given by mouth or through an IV tube.  In some cases, if your condition gets worse, you may need to have your baby early. Follow these instructions at home: Eating and drinking   Drink enough fluid to keep your pee (urine) pale yellow.  Avoid caffeine. Lifestyle  Do not use any products that contain nicotine or tobacco, such as cigarettes,  e-cigarettes, and chewing tobacco. If you need help quitting, ask your doctor.  Do not use alcohol or drugs.  Avoid stress.  Rest and get plenty of sleep.  Regular exercise can help. Ask your doctor what kinds of exercise are best for you. General instructions  Take over-the-counter and prescription medicines only as told by your doctor.  Keep all prenatal and follow-up visits as told by your doctor. This is important. Contact a doctor if:  You have symptoms that your doctor told you to watch for, such as: ? Headaches. ? Nausea. ? Vomiting. ? Belly (abdominal) pain. ? Dizziness. ? Light-headedness. Get help right away if:  You have: ? Very bad belly pain that does not get better with treatment. ? A very bad headache that does not get better. ? Vomiting that does not get better. ? Sudden, fast weight  gain. ? Sudden swelling in your hands, ankles, or face. ? Bleeding from your vagina. ? Blood in your pee. ? Blurry vision. ? Double vision. ? Shortness of breath. ? Chest pain. ? Weakness on one side of your body. ? Trouble talking.  Your baby is not moving as much as usual. Summary  High blood pressure is also called hypertension.  High blood pressure means that the force of your blood moving in your body is too strong.  High blood pressure can cause problems for you and your baby.  Keep all follow-up visits as told by your doctor. This is important. This information is not intended to replace advice given to you by your health care provider. Make sure you discuss any questions you have with your health care provider. Document Released: 11/24/2010 Document Revised: 02/12/2019 Document Reviewed: 11/18/2018 Elsevier Patient Education  2020 [employer].

## 2019-07-09 ENCOUNTER — Other Ambulatory Visit: Payer: Medicaid Other

## 2019-07-10 ENCOUNTER — Inpatient Hospital Stay (HOSPITAL_COMMUNITY): Payer: Medicaid Other | Admitting: Anesthesiology

## 2019-07-10 ENCOUNTER — Encounter (HOSPITAL_COMMUNITY): Payer: Self-pay | Admitting: *Deleted

## 2019-07-10 ENCOUNTER — Inpatient Hospital Stay (HOSPITAL_COMMUNITY)
Admission: AD | Admit: 2019-07-10 | Discharge: 2019-07-12 | DRG: 807 | Disposition: A | Discharge status: Home / Self Care | Payer: Medicaid Other | Attending: Obstetrics & Gynecology | Admitting: Obstetrics & Gynecology

## 2019-07-10 ENCOUNTER — Other Ambulatory Visit: Payer: Self-pay

## 2019-07-10 DIAGNOSIS — Z3A37 37 weeks gestation of pregnancy: Secondary | ICD-10-CM

## 2019-07-10 DIAGNOSIS — Z30430 Encounter for insertion of intrauterine contraceptive device: Secondary | ICD-10-CM

## 2019-07-10 DIAGNOSIS — Z3483 Encounter for supervision of other normal pregnancy, third trimester: Secondary | ICD-10-CM

## 2019-07-10 DIAGNOSIS — O134 Gestational [pregnancy-induced] hypertension without significant proteinuria, complicating childbirth: Secondary | ICD-10-CM | POA: Diagnosis present

## 2019-07-10 DIAGNOSIS — Z7722 Contact with and (suspected) exposure to environmental tobacco smoke (acute) (chronic): Secondary | ICD-10-CM | POA: Diagnosis present

## 2019-07-10 DIAGNOSIS — O139 Gestational [pregnancy-induced] hypertension without significant proteinuria, unspecified trimester: Secondary | ICD-10-CM | POA: Diagnosis present

## 2019-07-10 LAB — COMPREHENSIVE METABOLIC PANEL
ALT: 9 U/L (ref 0–44)
AST: 15 U/L (ref 15–41)
Albumin: 3.1 g/dL — ABNORMAL LOW (ref 3.5–5.0)
Alkaline Phosphatase: 123 U/L (ref 38–126)
Anion gap: 10 (ref 5–15)
BUN: 5 mg/dL — ABNORMAL LOW (ref 6–20)
CO2: 21 mmol/L — ABNORMAL LOW (ref 22–32)
Calcium: 9.2 mg/dL (ref 8.9–10.3)
Chloride: 104 mmol/L (ref 98–111)
Creatinine, Ser: 0.54 mg/dL (ref 0.44–1.00)
GFR calc Af Amer: >60 mL/min (ref >60)
GFR calc non Af Amer: >60 mL/min (ref >60)
Glucose, Bld: 78 mg/dL (ref 70–99)
Potassium: 3.5 mmol/L (ref 3.5–5.1)
Sodium: 135 mmol/L (ref 135–145)
Total Bilirubin: 0.5 mg/dL (ref 0.3–1.2)
Total Protein: 6.1 g/dL — ABNORMAL LOW (ref 6.5–8.1)

## 2019-07-10 LAB — CULTURE, BETA STREP (GROUP B ONLY): Strep Gp B Culture: NEGATIVE

## 2019-07-10 LAB — CBC
HCT: 36.9 % (ref 36.0–46.0)
Hemoglobin: 12.6 g/dL (ref 12.0–15.0)
MCH: 30.7 pg (ref 26.0–34.0)
MCHC: 34.1 g/dL (ref 30.0–36.0)
MCV: 89.8 fL (ref 80.0–100.0)
Platelets: 232 10*3/uL (ref 150–400)
RBC: 4.11 MIL/uL (ref 3.87–5.11)
RDW: 12.3 % (ref 11.5–15.5)
WBC: 10.3 10*3/uL (ref 4.0–10.5)
nRBC: 0 % (ref 0.0–0.2)

## 2019-07-10 LAB — TYPE AND SCREEN
ABO/RH(D): A POS
Antibody Screen: NEGATIVE

## 2019-07-10 LAB — PROTEIN / CREATININE RATIO, URINE
Creatinine, Urine: 187.48 mg/dL
Protein Creatinine Ratio: 0.12 mg/mg{Cre} (ref 0.00–0.15)
Total Protein, Urine: 22 mg/dL

## 2019-07-10 LAB — RPR: RPR Ser Ql: NONREACTIVE

## 2019-07-10 MED ORDER — FENTANYL CITRATE (PF) 100 MCG/2ML IJ SOLN
50.0000 ug | INTRAMUSCULAR | Status: DC | PRN
  Administered 2019-07-10: 100 ug via INTRAVENOUS
  Filled 2019-07-10: qty 2

## 2019-07-10 MED ORDER — OXYTOCIN 40 UNITS IN NORMAL SALINE INFUSION - SIMPLE MED
1.0000 m[IU]/min | INTRAVENOUS | Status: DC
  Administered 2019-07-10: 2 m[IU]/min via INTRAVENOUS
  Filled 2019-07-10: qty 1000

## 2019-07-10 MED ORDER — MISOPROSTOL 25 MCG QUARTER TABLET
25.0000 ug | ORAL_TABLET | ORAL | Status: DC | PRN
  Administered 2019-07-10: 25 ug via VAGINAL

## 2019-07-10 MED ORDER — MISOPROSTOL 200 MCG PO TABS
ORAL_TABLET | ORAL | Status: AC
  Filled 2019-07-10: qty 4

## 2019-07-10 MED ORDER — MISOPROSTOL 200 MCG PO TABS
800.0000 ug | ORAL_TABLET | Freq: Once | ORAL | Status: AC
  Administered 2019-07-10: 800 ug via RECTAL

## 2019-07-10 MED ORDER — LABETALOL HCL 5 MG/ML IV SOLN: 20.0000 mg | INTRAVENOUS | Status: DC | PRN

## 2019-07-10 MED ORDER — OXYCODONE-ACETAMINOPHEN 5-325 MG PO TABS: 2.0000 | ORAL_TABLET | ORAL | Status: DC | PRN

## 2019-07-10 MED ORDER — LACTATED RINGERS IV SOLN
INTRAVENOUS | Status: DC
  Administered 2019-07-10: 08:00:00 via INTRAVENOUS

## 2019-07-10 MED ORDER — ONDANSETRON HCL 4 MG PO TABS: 4.0000 mg | ORAL_TABLET | ORAL | Status: DC | PRN

## 2019-07-10 MED ORDER — FENTANYL-BUPIVACAINE-NACL 0.5-0.125-0.9 MG/250ML-% EP SOLN
12.0000 mL/h | EPIDURAL | Status: DC | PRN
  Filled 2019-07-10: qty 250

## 2019-07-10 MED ORDER — MISOPROSTOL 25 MCG QUARTER TABLET
ORAL_TABLET | ORAL | Status: AC
  Administered 2019-07-10: 25 ug
  Filled 2019-07-10: qty 1

## 2019-07-10 MED ORDER — HYDROXYZINE HCL 50 MG PO TABS: 50.0000 mg | ORAL_TABLET | Freq: Four times a day (QID) | ORAL | Status: DC | PRN

## 2019-07-10 MED ORDER — EPHEDRINE 5 MG/ML INJ: 10.0000 mg | INTRAVENOUS | Status: DC | PRN

## 2019-07-10 MED ORDER — BENZOCAINE-MENTHOL 20-0.5 % EX AERO
1.0000 "application " | INHALATION_SPRAY | CUTANEOUS | Status: DC | PRN
  Administered 2019-07-11: 1 via TOPICAL
  Filled 2019-07-10: qty 56

## 2019-07-10 MED ORDER — SODIUM CHLORIDE (PF) 0.9 % IJ SOLN
INTRAMUSCULAR | Status: DC | PRN
  Administered 2019-07-10: 12 mL/h via EPIDURAL

## 2019-07-10 MED ORDER — MEASLES, MUMPS & RUBELLA VAC IJ SOLR: 0.5000 mL | Freq: Once | INTRAMUSCULAR | Status: DC

## 2019-07-10 MED ORDER — TERBUTALINE SULFATE 1 MG/ML IJ SOLN: 0.2500 mg | Freq: Once | INTRAMUSCULAR | Status: DC | PRN

## 2019-07-10 MED ORDER — PHENYLEPHRINE 40 MCG/ML (10ML) SYRINGE FOR IV PUSH (FOR BLOOD PRESSURE SUPPORT)
80.0000 ug | PREFILLED_SYRINGE | INTRAVENOUS | Status: DC | PRN
  Filled 2019-07-10: qty 10

## 2019-07-10 MED ORDER — WITCH HAZEL-GLYCERIN EX PADS: 1.0000 "application " | MEDICATED_PAD | CUTANEOUS | Status: DC | PRN

## 2019-07-10 MED ORDER — OXYTOCIN BOLUS FROM INFUSION
500.0000 mL | Freq: Once | INTRAVENOUS | Status: AC
  Administered 2019-07-10: 500 mL via INTRAVENOUS

## 2019-07-10 MED ORDER — ONDANSETRON HCL 4 MG/2ML IJ SOLN
4.0000 mg | INTRAMUSCULAR | Status: DC | PRN
  Administered 2019-07-10: 4 mg via INTRAVENOUS
  Filled 2019-07-10: qty 2

## 2019-07-10 MED ORDER — DIBUCAINE (PERIANAL) 1 % EX OINT: 1.0000 "application " | TOPICAL_OINTMENT | CUTANEOUS | Status: DC | PRN

## 2019-07-10 MED ORDER — LIDOCAINE HCL (PF) 1 % IJ SOLN: 30.0000 mL | INTRAMUSCULAR | Status: DC | PRN

## 2019-07-10 MED ORDER — OXYTOCIN 40 UNITS IN NORMAL SALINE INFUSION - SIMPLE MED: 2.5000 [IU]/h | INTRAVENOUS | Status: DC

## 2019-07-10 MED ORDER — LABETALOL HCL 5 MG/ML IV SOLN: 40.0000 mg | INTRAVENOUS | Status: DC | PRN

## 2019-07-10 MED ORDER — FLEET ENEMA 7-19 GM/118ML RE ENEM: 1.0000 | ENEMA | RECTAL | Status: DC | PRN

## 2019-07-10 MED ORDER — ONDANSETRON HCL 4 MG/2ML IJ SOLN: 4.0000 mg | Freq: Four times a day (QID) | INTRAMUSCULAR | Status: DC | PRN

## 2019-07-10 MED ORDER — LEVONORGESTREL 19.5 MCG/DAY IU IUD
INTRAUTERINE_SYSTEM | Freq: Once | INTRAUTERINE | Status: AC
  Administered 2019-07-10: 1 via INTRAUTERINE
  Filled 2019-07-10: qty 1

## 2019-07-10 MED ORDER — TETANUS-DIPHTH-ACELL PERTUSSIS 5-2.5-18.5 LF-MCG/0.5 IM SUSP: 0.5000 mL | Freq: Once | INTRAMUSCULAR | Status: DC

## 2019-07-10 MED ORDER — IBUPROFEN 600 MG PO TABS
600.0000 mg | ORAL_TABLET | Freq: Three times a day (TID) | ORAL | Status: DC | PRN
  Administered 2019-07-11 – 2019-07-12 (×3): 600 mg via ORAL
  Filled 2019-07-10 (×2): qty 1

## 2019-07-10 MED ORDER — DIPHENHYDRAMINE HCL 25 MG PO CAPS: 25.0000 mg | ORAL_CAPSULE | Freq: Four times a day (QID) | ORAL | Status: DC | PRN

## 2019-07-10 MED ORDER — SOD CITRATE-CITRIC ACID 500-334 MG/5ML PO SOLN: 30.0000 mL | ORAL | Status: DC | PRN

## 2019-07-10 MED ORDER — SENNOSIDES-DOCUSATE SODIUM 8.6-50 MG PO TABS
2.0000 | ORAL_TABLET | ORAL | Status: DC
  Administered 2019-07-11: 2 via ORAL
  Filled 2019-07-10 (×2): qty 2

## 2019-07-10 MED ORDER — ACETAMINOPHEN 325 MG PO TABS: 650.0000 mg | ORAL_TABLET | ORAL | Status: DC | PRN

## 2019-07-10 MED ORDER — LACTATED RINGERS IV SOLN: 500.0000 mL | INTRAVENOUS | Status: DC | PRN

## 2019-07-10 MED ORDER — COCONUT OIL OIL: 1.0000 "application " | TOPICAL_OIL | Status: DC | PRN

## 2019-07-10 MED ORDER — PHENYLEPHRINE 40 MCG/ML (10ML) SYRINGE FOR IV PUSH (FOR BLOOD PRESSURE SUPPORT)
80.0000 ug | PREFILLED_SYRINGE | INTRAVENOUS | Status: DC | PRN
  Administered 2019-07-10: 80 ug via INTRAVENOUS

## 2019-07-10 MED ORDER — PRENATAL MULTIVITAMIN CH
1.0000 | ORAL_TABLET | Freq: Every day | ORAL | Status: DC
  Administered 2019-07-11 – 2019-07-12 (×2): 1 via ORAL
  Filled 2019-07-10 (×2): qty 1

## 2019-07-10 MED ORDER — LIDOCAINE HCL (PF) 1 % IJ SOLN
INTRAMUSCULAR | Status: DC | PRN
  Administered 2019-07-10: 8 mL via EPIDURAL
  Administered 2019-07-10: 3 mL via EPIDURAL

## 2019-07-10 MED ORDER — SIMETHICONE 80 MG PO CHEW
80.0000 mg | CHEWABLE_TABLET | ORAL | Status: DC | PRN
  Filled 2019-07-10: qty 1

## 2019-07-10 MED ORDER — ACETAMINOPHEN 325 MG PO TABS
650.0000 mg | ORAL_TABLET | Freq: Four times a day (QID) | ORAL | Status: DC | PRN
  Administered 2019-07-10 – 2019-07-11 (×3): 650 mg via ORAL
  Filled 2019-07-10 (×3): qty 2

## 2019-07-10 MED ORDER — LABETALOL HCL 5 MG/ML IV SOLN: 80.0000 mg | INTRAVENOUS | Status: DC | PRN

## 2019-07-10 MED ORDER — DIPHENHYDRAMINE HCL 50 MG/ML IJ SOLN: 12.5000 mg | INTRAMUSCULAR | Status: DC | PRN

## 2019-07-10 MED ORDER — OXYCODONE-ACETAMINOPHEN 5-325 MG PO TABS: 1.0000 | ORAL_TABLET | ORAL | Status: DC | PRN

## 2019-07-10 MED ORDER — HYDRALAZINE HCL 20 MG/ML IJ SOLN: 10.0000 mg | INTRAMUSCULAR | Status: DC | PRN

## 2019-07-10 MED ORDER — LACTATED RINGERS IV SOLN: 500.0000 mL | Freq: Once | INTRAVENOUS | Status: DC

## 2019-07-10 NOTE — Progress Notes (Signed)
Labor Progress Note Jamie Maynard is a 23 y.o. G2P1001 at [redacted]w[redacted]d presented for IOL secondary to gHTN. S: Patient just received Epidural.   O:  BP 111/66   Pulse 81   Temp 98.5 F (36.9 C) (Oral)   Resp 16   Ht 5\' 7"  (1.702 m)   Wt 73 kg   LMP 10/24/2018 (Exact Date)   SpO2 99%   BMI 25.22 kg/m  EFM: 145, reactive, moderate variability, pos accels, no decels Ctx: q3-65m  CVE: Dilation: 5 Effacement (%): 70 Station: Ballotable Presentation: Vertex Exam by:: Cecelia Byars, RN   A&P: 24 y.o. G2P1001 [redacted]w[redacted]d here for IOL secondary to gHTN. #Labor: Progressing well. S/p Cytotec x 1 and Foley bulb. Will plan to start Pit. #Pain: Epidural #FWB: Cat I #GBS negative #gHTN: Asymptomatic. Serial BP's. Pre-E labs neg 9/1. BP Readings from Last 4 Encounters:  07/10/19 111/66  07/08/19 121/83  07/07/19 130/83  07/07/19 133/83   Chauncey Mann, MD 1:44 PM

## 2019-07-10 NOTE — H&P (Signed)
OBSTETRIC ADMISSION HISTORY AND PHYSICAL  Jamie Maynard is a 23 y.o. female G2P1001 with IUP at 8339w0d by LMP and 10 wk US presenting for IOL secondary to gHTN. She reports +FMs, No LOF, no VB, no blurry vision, headaches or peripheral edema, and RUQ pain.  She plans on breast feeding. She request PP IUD for birth control. [facility]   Dating: By LMP and 10 wk US --->  Estimated Date of Delivery: 07/31/19  Sono:  Anatomy on 03/02/2019  [place] 18+3 wks,cx 3.3 cm,svp of fluid 4 cm,anterior placenta gr 0, normal ovaries bilat,fht 146 bpm,efw 214 g 18%,anatomy complete,no obvious abnormalities   Prenatal History/Complications: None  Past Medical History: Past Medical History:  Diagnosis Date  . Medical history non-contributory     Past Surgical History: Past Surgical History:  Procedure Laterality Date  . TONSILLECTOMY AND ADENOIDECTOMY Bilateral 2003   [facility]    Obstetrical History: OB History    Gravida  2   Para  1   Term  1   Preterm      AB      Living  1     SAB      TAB      Ectopic      Multiple      Live Births  1           Social History: Social History   Socioeconomic History  . Marital status: Single    Spouse name: Not on file  . Number of children: 1  . Years of education: Not on file  . Highest education level: Not on file  Occupational History  . Not on file  Social Needs  . Financial resource strain: Not hard at all  . Food insecurity    Worry: Never true    Inability: Not on file  . Transportation needs    Medical: Not on file    Non-medical: Not on file  Tobacco Use  . Smoking status: Passive Smoke Exposure - Never Smoker  . Smokeless tobacco: Never Used  Substance and Sexual Activity  . Alcohol use: No  . Drug use: No  . Sexual activity: Yes    Birth control/protection: None  Lifestyle  . Physical activity    Days per week: Not on file    Minutes per  session: Not on file  . Stress: Not on file  Relationships  . Social Musicianconnections    Talks on phone: Not on file    Gets together: Not on file    Attends religious service: Not on file    Active member of club or organization: Not on file    Attends meetings of clubs or organizations: Not on file    Relationship status: Not on file  Other Topics Concern  . Not on file  Social History Narrative    She lives with her father, [id number] year old brother, and [id number] year old daughter       Family History: Family History  Problem Relation Age of Onset  . Depression Father   . Stroke Father   . Hypertension Father   . Heart disease Father   . Heart attack Maternal Grandfather   . Congestive Heart Failure Paternal Grandmother   . Migraines Paternal Grandmother   . COPD Paternal Grandmother   . Lung cancer Paternal Grandfather   . Autism Cousin        2 paternal first cousins have  autism  . ADD / ADHD Cousin        Several paternal first cousins have ADHD/ADD    Allergies: Allergies  Allergen Reactions  . Other Hives and Rash    Glitter make-up causes rash and hives     Medications Prior to Admission  Medication Sig Dispense Refill Last Dose  . meclizine (ANTIVERT) 12.5 MG tablet Take 1 tablet (12.5 mg total) by mouth 3 (three) times daily as needed for dizziness. 30 tablet 0   . ondansetron (ZOFRAN ODT) 4 MG disintegrating tablet Take 1 tablet (4 mg total) by mouth every 6 (six) hours as needed for nausea. 30 tablet 2   . prenatal vitamin w/FE, FA (PRENATAL 1 + 1) 27-1 MG TABS tablet Take 1 tablet by mouth daily at 12 noon. 30 each 12      Review of Systems   All systems reviewed and negative except as stated in HPI  Blood pressure 127/88, pulse 85, temperature 98.4 F (36.9 C), temperature source Oral, resp. rate 16, height 5\' 7"  (1.702 m), weight 73 kg, last menstrual period 10/24/2018, unknown if currently breastfeeding. General appearance: alert, cooperative, appears stated age  and no distress Lungs: normal effort Heart: regular rate  Abdomen: soft, non-tender; bowel sounds normal Pelvic: gravid uterus GU: No vaginal lesions  Extremities: Homans sign is negative, no sign of DVT Presentation: cephalic by exam Fetal monitoringBaseline: 145 bpm, Variability: Good {> 6 bpm), Accelerations: Reactive and Decelerations: Absent Uterine activityFrequency: Every 5 minutes Dilation: 4.5 Effacement (%): 60 Station: Ballotable Exam by:: Henderson Newcomer, RN   Prenatal labs: ABO, Rh: --/--/A POS (09/04 0815) Antibody: NEG (09/04 0815) Rubella: 2.84 (03/09 1251) RPR: Non Reactive (06/18 0916)  HBsAg: Negative (03/09 1251)  HIV: Non Reactive (06/18 0916)  GBS:    2 hr Glucola WNL Genetic screening  normal Anatomy US normal  Prenatal Transfer Tool  Maternal Diabetes: No Genetic Screening: Normal Maternal Ultrasounds/Referrals: Normal Fetal Ultrasounds or other Referrals:  None Maternal Substance Abuse:  No Significant Maternal Medications:  None Significant Maternal Lab Results: Group B Strep negative  Results for orders placed or performed during the hospital encounter of 07/10/19 (from the past 24 hour(s))  Type and screen   Collection Time: 07/10/19  8:15 AM  Result Value Ref Range   ABO/RH(D) A POS    Antibody Screen NEG    Sample Expiration      07/13/2019,2359 Performed at [facility], [address] [place], [place] [address]   CBC   Collection Time: 07/10/19  8:19 AM  Result Value Ref Range   WBC 10.3 4.0 - 10.5 K/uL   RBC 4.11 3.87 - 5.11 MIL/uL   Hemoglobin 12.6 12.0 - 15.0 g/dL   HCT 36.6 29.4 - 76.5 %   MCV 89.8 80.0 - 100.0 fL   MCH 30.7 26.0 - 34.0 pg   MCHC 34.1 30.0 - 36.0 g/dL   RDW 46.5 03.5 - 46.5 %   Platelets 232 150 - 400 K/uL   nRBC 0.0 0.0 - 0.2 %  Comprehensive metabolic panel   Collection Time: 07/10/19  8:19 AM  Result Value Ref Range   Sodium 135 135 - 145 mmol/L   Potassium 3.5 3.5 - 5.1 mmol/L   Chloride  104 98 - 111 mmol/L   CO2 21 (L) 22 - 32 mmol/L   Glucose, Bld 78 70 - 99 mg/dL   BUN 5 (L) 6 - 20 mg/dL   Creatinine, Ser 6.81 0.44 -  1.00 mg/dL   Calcium 9.2 8.9 - 10.3 mg/dL   Total Protein 6.1 (L) 6.5 - 8.1 g/dL   Albumin 3.1 (L) 3.5 - 5.0 g/dL   AST 15 15 - 41 U/L   ALT 9 0 - 44 U/L   Alkaline Phosphatase 123 38 - 126 U/L   Total Bilirubin 0.5 0.3 - 1.2 mg/dL   GFR calc non Af Amer >60 >60 mL/min   GFR calc Af Amer >60 >60 mL/min   Anion gap 10 5 - 15    Patient Active Problem List   Diagnosis Date Noted  . Gestational hypertension 07/10/2019  . Transient hypertension of pregnancy in third trimester 07/07/2019  . Supervision of normal pregnancy 01/12/2019    Assessment/Plan:  Jamie Maynard is a 23 y.o. G2P1001 at [redacted]w[redacted]d here for IOL secondary to gHTN.   #Labor: Cytotec and Foley bulb at 0945. Will recheck ~ 1345 and likely plan to start Pitocin thereafter.  #Pain: Epidural when desires #FWB: Cat I; EFW: 3400g #ID:  GBS neg #MOF: breast #MOC: PP IUD; patient consented and Liletta ordered #Circ:  Outpatient circ #gHTN: Asymptomatic. Serial BP's. Pre-E labs neg 9/1.  Barrington Ellison, MD [facility] Family Medicine Fellow, [facility], [place] 07/10/2019, 11:44 AM

## 2019-07-10 NOTE — Anesthesia Preprocedure Evaluation (Signed)
Anesthesia Evaluation  Patient identified by MRN, date of birth, ID band Patient awake    Reviewed: Allergy & Precautions, NPO status , Patient's Chart, lab work & pertinent test results  Airway Mallampati: II  TM Distance: >3 FB Neck ROM: Full    Dental no notable dental hx.    Pulmonary neg pulmonary ROS,    Pulmonary exam normal breath sounds clear to auscultation       Cardiovascular hypertension, Pt. on medications Normal cardiovascular exam Rhythm:Regular Rate:Normal     Neuro/Psych negative neurological ROS  negative psych ROS   GI/Hepatic negative GI ROS, Neg liver ROS,   Endo/Other  negative endocrine ROS  Renal/GU negative Renal ROS  negative genitourinary   Musculoskeletal negative musculoskeletal ROS (+)   Abdominal   Peds negative pediatric ROS (+)  Hematology negative hematology ROS (+)   Anesthesia Other Findings   Reproductive/Obstetrics (+) Pregnancy                             Anesthesia Physical Anesthesia Plan  ASA: II  Anesthesia Plan: Epidural   Post-op Pain Management:    Induction:   PONV Risk Score and Plan: 2  Airway Management Planned:   Additional Equipment: None  Intra-op Plan:   Post-operative Plan:   Informed Consent: I have reviewed the patients History and Physical, chart, labs and discussed the procedure including the risks, benefits and alternatives for the proposed anesthesia with the patient or authorized representative who has indicated his/her understanding and acceptance.       Plan Discussed with:   Anesthesia Plan Comments:         Anesthesia Quick Evaluation

## 2019-07-10 NOTE — Discharge Summary (Addendum)
Postpartum Discharge Summary     Patient Name: Jamie Maynard DOB: 20-Jan-1996 MRN: [id number]  Date of admission: 07/10/2019 Delivering Provider: Chauncey Mann   Date of discharge: 07/11/2019  Admitting diagnosis: INDUCTION Intrauterine pregnancy: [redacted]w[redacted]d    Secondary diagnosis:  Active Problems:   Gestational hypertension   Pregnancy with 37 weeks completed gestation  Additional problems: None     Discharge diagnosis: Term Pregnancy Delivered and Gestational Hypertension                                                                                                Post partum procedures:Post-placental IUD insertion  Augmentation: Pitocin, Cytotec and Foley Balloon  Complications: None  Hospital course:  Induction of Labor With Vaginal Delivery   23y.o. yo G2P1001 at 341w0das admitted to the hospital 07/10/2019 for induction of labor.  Indication for induction: Gestational hypertension.  Patient had an uncomplicated labor course as follows. Patient presented to L&D for IOL secondary to gHTN. Initial SVE: 3/50%/-3. Patient received Cytotec, Foley bulb and Pitocin. Patient received Epidural and then had SROM. She then progressed to complete.  Membrane Rupture Time/Date: 3:30 PM ,07/10/2019   Intrapartum Procedures: Episiotomy: None [1]                                         Lacerations:  None [1]  Patient had delivery of a Viable infant.  Information for the patient's newborn:  HaMarikay, Roads0[[id number]]    07/10/2019  Details of delivery can be found in separate delivery note.  Patient had a routine postpartum course. Patient doing well. Bps within normal limits. Should check BP X 2 day; pt has cuff at home. Patient is discharged home 07/11/19. Delivery time: 4:07 PM    Magnesium Sulfate received: No BMZ received: No Rhophylac:No MMR:No Transfusion:No  Physical exam  Vitals:   07/10/19 2126 07/11/19 0024 07/11/19 0529 07/11/19 1939  BP: 131/81 131/80 100/63  109/71  Pulse: 91 72 70   Resp: _0 Temp: 98.1 F (36.7 C) 98.7 F (37.1 C) 98 F (36.7 C) 99 F (37.2 C)  TempSrc: Oral Oral Oral Oral  SpO2:      Weight:      Height:       General: NAD Abdomen: soft, nttp, nd. Firm fundus below umbilicus.  Perineum: deferred Skin:  Warm and dry.  Cardiovascular: S1, S2 normal, no murmur, rub or gallop, regular rate and rhythm Respiratory:  Clear to auscultation bilateral. Normal respiratory effort Extremities: no c/c/e  Labs: Lab Results  Component Value Date   WBC 10.3 07/10/2019   HGB 12.6 07/10/2019   HCT 36.9 07/10/2019   MCV 89.8 07/10/2019   PLT 232 07/10/2019   CMP Latest Ref Rng & Units 07/10/2019  Glucose 70 - 99 mg/dL 78  BUN 6 - 20 mg/dL 5(L)  Creatinine 0.44 - 1.00 mg/dL 0.54  Sodium 135 - 145 mmol/L 135  Potassium 3.5 - 5.1  mmol/L 3.5  Chloride 98 - 111 mmol/L 104  CO2 22 - 32 mmol/L 21(L)  Calcium 8.9 - 10.3 mg/dL 9.2  Total Protein 6.5 - 8.1 g/dL 6.1(L)  Total Bilirubin 0.3 - 1.2 mg/dL 0.5  Alkaline Phos 38 - 126 U/L 123  AST 15 - 41 U/L 15  ALT 0 - 44 U/L 9    Discharge instruction: per After Visit Summary and "Baby and Me Booklet".  After visit meds:  Allergies as of 07/11/2019      Reactions   Other Hives, Rash   Glitter make-up causes rash and hives       Medication List    TAKE these medications   acetaminophen 325 MG tablet Commonly known as: Tylenol Take 2 tablets (650 mg total) by mouth every 6 (six) hours as needed (for pain scale < 4).   ibuprofen 600 MG tablet Commonly known as: ADVIL Take 1 tablet (600 mg total) by mouth every 8 (eight) hours as needed for mild pain.   meclizine 12.5 MG tablet Commonly known as: ANTIVERT Take 1 tablet (12.5 mg total) by mouth 3 (three) times daily as needed for dizziness.   ondansetron 4 MG disintegrating tablet Commonly known as: Zofran ODT Take 1 tablet (4 mg total) by mouth every 6 (six) hours as needed for nausea.   prenatal vitamin w/FE,  FA 27-1 MG Tabs tablet Take 1 tablet by mouth daily at 12 noon.       Diet: routine diet  Activity: Advance as tolerated. Pelvic rest for 6 weeks.   Outpatient follow up:4 weeks Follow up Appt: Future Appointments  Date Time Provider [place]  07/16/2019  1:50 PM Florian Buff, MD [facility]   Follow up Visit: [place]. Go in 1 week(s).   Why: blood pressure check Contact information: [place] [id number] [phone]           Please schedule this patient for Postpartum visit in: 4 weeks with the following provider: Any provider Low risk pregnancy complicated by: gHTN. Delivery mode:  SVD Anticipated Birth Control: Liletta IUD placed post-placentally PP Procedures needed: BP check (request sent for virtual bp check 1wk) Schedule Integrated BH visit: no   Newborn Data: Live born female  Birth Weight:  2424g APGAR: 9, 9  Newborn Delivery   Birth date/time: 07/10/2019 16:07:00 Delivery type: Vaginal, Spontaneous      Baby Feeding: Breast Disposition:home with mother   Durene Romans MD Attending Center for [employer] (Faculty Practice) 07/11/2019

## 2019-07-10 NOTE — Anesthesia Procedure Notes (Signed)
Epidural Patient location during procedure: OR Start time: 07/10/2019 1:07 PM End time: 07/10/2019 1:23 PM  Staffing Anesthesiologist: Pervis Hocking, DO Performed: anesthesiologist   Preanesthetic Checklist Completed: patient identified, pre-op evaluation, timeout performed, IV checked, risks and benefits discussed and monitors and equipment checked  Epidural Patient position: sitting Prep: site prepped and draped and DuraPrep Patient monitoring: continuous pulse ox, blood pressure, heart rate and cardiac monitor Approach: midline Location: L3-L4 Injection technique: LOR air  Needle:  Needle type: Tuohy  Needle gauge: 17 G Needle length: 9 cm Needle insertion depth: 4 cm Catheter type: closed end flexible Catheter size: 19 Gauge Catheter at skin depth: 9 cm Test dose: negative  Assessment Sensory level: T8 Events: blood not aspirated, injection not painful, no injection resistance, negative IV test and no paresthesia  Additional Notes Patient identified. Risks/Benefits/Options discussed with patient including but not limited to bleeding, infection, nerve damage, paralysis, failed block, incomplete pain control, headache, blood pressure changes, nausea, vomiting, reactions to medication both or allergic, itching and postpartum back pain. Confirmed with bedside nurse the patient's most recent platelet count. Confirmed with patient that they are not currently taking any anticoagulation, have any bleeding history or any family history of bleeding disorders. Patient expressed understanding and wished to proceed. All questions were answered. Sterile technique was used throughout the entire procedure. Please see nursing notes for vital signs. Test dose was given through epidural catheter and negative prior to continuing to dose epidural or start infusion. Warning signs of high block given to the patient including shortness of breath, tingling/numbness in hands, complete motor block,  or any concerning symptoms with instructions to call for help. Patient was given instructions on fall risk and not to get out of bed. All questions and concerns addressed with instructions to call with any issues or inadequate analgesia.  Reason for block:procedure for pain

## 2019-07-10 NOTE — Progress Notes (Addendum)
Progress Note: Paged by RN that uterus was boggy and patient had had more uterine bleeding. Went bedside to assess patient. Uterus firmed with massage and minimal bleeding noted on my exam. 800 mcg rectal Cytotec placed. Vitals stable and patient asymptomatic.   Vitals:   07/10/19 1645 07/10/19 1700 07/10/19 1743 07/10/19 1827  BP: 126/71 126/85 132/86 127/81  Pulse: 81 84 77 82  Resp: 18 18 18 17   Temp:   98.5 F (36.9 C) 98.4 F (36.9 C)  TempSrc:   Oral Oral  SpO2:   100% 100%  Weight:      Height:       Barrington Ellison, MD OB Family Medicine Fellow, [facility] for [employer], [place]

## 2019-07-11 LAB — ABO/RH: ABO/RH(D): A POS

## 2019-07-11 MED ORDER — ACETAMINOPHEN 325 MG PO TABS: 650.0000 mg | ORAL_TABLET | Freq: Four times a day (QID) | ORAL | Status: DC | PRN

## 2019-07-11 MED ORDER — IBUPROFEN 600 MG PO TABS: 600.0000 mg | ORAL_TABLET | Freq: Three times a day (TID) | ORAL | 0 refills | Status: DC | PRN

## 2019-07-11 NOTE — Anesthesia Postprocedure Evaluation (Signed)
Anesthesia Post Note  Patient: Jamie Maynard  Procedure(s) Performed: AN AD [place]     Patient location during evaluation: Mother Baby Anesthesia Type: Epidural Level of consciousness: awake Pain management: satisfactory to patient Vital Signs Assessment: post-procedure vital signs reviewed and stable Respiratory status: spontaneous breathing Cardiovascular status: stable Anesthetic complications: no    Last Vitals:  Vitals:   07/11/19 0024 07/11/19 0529  BP: 131/80 100/63  Pulse: 72 70  Resp: 18 18  Temp: 37.1 C 36.7 C  SpO2:      Last Pain:  Vitals:   07/11/19 0530  TempSrc:   PainSc: 0-No pain   Pain Goal:                   [employer]

## 2019-07-11 NOTE — Progress Notes (Signed)
Daily Post Partum Note  07/11/2019 Jamie Maynard is a 23 y.o. G3T5176 PPD#1 s/p  SVD/intact perineum and Liletta IUD insertion  @ [redacted]w[redacted]d  Pregnancy c/b IOL for gHTN (no meds) 24hr/overnight events:  none  Subjective:  No s/s of pre-eclampsia and meeting all pp goals  Objective:    Current Vital Signs 24h Vital Sign Ranges  T 98 F (36.7 C) Temp  Avg: 98.3 F (36.8 C)  Min: 98 F (36.7 C)  Max: 98.7 F (37.1 C)  BP 100/63 BP  Min: 96/65  Max: 139/84  HR 70 Pulse  Avg: 84.5  Min: 70  Max: 112  RR 18 Resp  Avg: 18.6  Min: 16  Max: 20  SaO2 100 %   SpO2  Avg: 99.8 %  Min: 99 %  Max: 100 %       24 Hour I/O Current Shift I/O  Time Ins Outs 09/04 0701 - 09/05 0700 In: -  Out: 700 [Urine:450] No intake/output data recorded.    General: NAD Abdomen: soft, nttp, nd. Firm fundus below umbilicus.  Perineum: deferred Skin:  Warm and dry.  Cardiovascular: S1, S2 normal, no murmur, rub or gallop, regular rate and rhythm Respiratory:  Clear to auscultation bilateral. Normal respiratory effort Extremities: no c/c/e  Medications Current Facility-Administered Medications  Medication Dose Route Frequency Provider Last Rate Last Dose  . acetaminophen (TYLENOL) tablet 650 mg  650 mg Oral Q6H PRN FChauncey Mann MD   650 mg at 07/11/19 0531  . benzocaine-Menthol (DERMOPLAST) 20-0.5 % topical spray 1 application  1 application Topical PRN FChauncey Mann MD   1 application at 016/07/370531  . coconut oil  1 application Topical PRN Fair, CMarin Shutter MD      . witch hazel-glycerin (TUCKS) pad 1 application  1 application Topical PRN Fair, CMarin Shutter MD       And  . dibucaine (NUPERCAINAL) 1 % rectal ointment 1 application  1 application Rectal PRN Fair, CMarin Shutter MD      . diphenhydrAMINE (BENADRYL) capsule 25 mg  25 mg Oral Q6H PRN Fair, Chelsea N, MD      . ibuprofen (ADVIL) tablet 600 mg  600 mg Oral Q8H PRN FChauncey Mann MD   600 mg at 07/11/19 0020  . measles, mumps & rubella  vaccine (MMR) injection 0.5 mL  0.5 mL Subcutaneous Once Fair, Chelsea N, MD      . ondansetron (ZOFRAN) tablet 4 mg  4 mg Oral Q4H PRN Fair, CMarin Shutter MD       Or  . ondansetron (ZOFRAN) injection 4 mg  4 mg Intravenous Q4H PRN FChauncey Mann MD   4 mg at 07/10/19 2213  . prenatal multivitamin tablet 1 tablet  1 tablet Oral Q1200 Fair, CMarin Shutter MD   1 tablet at 07/11/19 1120  . senna-docusate (Senokot-S) tablet 2 tablet  2 tablet Oral Q24H Fair, Chelsea N, MD      . simethicone (MYLICON) chewable tablet 80 mg  80 mg Oral PRN Fair, CMarin Shutter MD      . Tdap (BOOSTRIX) injection 0.5 mL  0.5 mL Intramuscular Once Fair, CMarin Shutter MD       Facility-Administered Medications Ordered in Other Encounters  Medication Dose Route Frequency Provider Last Rate Last Dose  . fentaNYL (SUBLIMAZE) 500 mcg, bupivacaine (SENSORCAINE-MPF) 41.67 mL in sodium chloride (PF) 0.9 % 250 mL epidural    Continuous PRN FPervis Hocking DO 12 mL/hr at  07/10/19 1319 12 mL/hr at 07/10/19 1319  . lidocaine (PF) (XYLOCAINE) 1 % injection    Anesthesia Intra-op Pervis Hocking, DO   3 mL at 07/10/19 1319    Labs:  Recent Labs  Lab 07/07/19 0034 07/10/19 0819  WBC 11.4* 10.3  HGB 12.7 12.6  HCT 36.2 36.9  PLT 224 232   Recent Labs  Lab 07/07/19 0034 07/10/19 0819  NA 136 135  K 3.8 3.5  CL 103 104  CO2 22 21*  BUN 6 5*  CREATININE 0.61 0.54  GLUCOSE 94 78  CALCIUM 9.6 9.2    Assessment & Plan:  Pt doing well *Postpartum/postop: routine care.  A POS/formula/Liletta already in place *gHTN: doing well on no meds. Okay to go home today if baby can. 7-10d bp check. Pt has cuff at home and told to check 2x/day *Dispo: see above.   Durene Romans MD Attending Center for [facility])

## 2019-07-11 NOTE — Discharge Instructions (Addendum)
Check your blood pressures twice a day. Call if persistently if top number is persistently above 150 or bottom number above 100   Hypertension During Pregnancy Hypertension is also called high blood pressure. High blood pressure means that the force of your blood moving in your body is too strong. It can cause problems for you and your baby. Different types of high blood pressure can happen during pregnancy. The types are:  High blood pressure before you got pregnant. This is called chronic hypertension.  This can continue during your pregnancy. Your doctor will want to keep checking your blood pressure. You may need medicine to keep your blood pressure under control while you are pregnant. You will need follow-up visits after you have your baby.  High blood pressure that goes up during pregnancy when it was normal before. This is called gestational hypertension. It will usually get better after you have your baby, but your doctor will need to watch your blood pressure to make sure that it is getting better.  Very high blood pressure during pregnancy. This is called preeclampsia. Very high blood pressure is an emergency that needs to be checked and treated right away.  You may develop very high blood pressure after giving birth. This is called postpartum preeclampsia. This usually occurs within 48 hours after childbirth but may occur up to 6 weeks after giving birth. This is rare. How does this affect me? If you have high blood pressure during pregnancy, you have a higher chance of developing high blood pressure:  As you get older.  If you get pregnant again. In some cases, high blood pressure during pregnancy can cause:  Stroke.  Heart attack.  Damage to the kidneys, lungs, or liver.  Preeclampsia.  Jerky movements you cannot control (convulsions or seizures).  Problems with the placenta.   What can I do to lower my risk?   Keep a healthy weight.  Eat a healthy diet.  Follow  what your doctor tells you about treating any medical problems that you had before becoming pregnant. It is very important to go to all of your doctor visits. Your doctor will check your blood pressure and make sure that your pregnancy is progressing as it should. Treatment should start early if a problem is found.   Follow these instructions at home: Eating and drinking   Drink enough fluid to keep your pee (urine) pale yellow.  Avoid caffeine. Lifestyle  Do not use any products that contain nicotine or tobacco, such as cigarettes, e-cigarettes, and chewing tobacco. If you need help quitting, ask your doctor.  Do not use alcohol or drugs.  Avoid stress.  Rest and get plenty of sleep.  Regular exercise can help. Ask your doctor what kinds of exercise are best for you. General instructions  Take over-the-counter and prescription medicines only as told by your doctor.  Keep all prenatal and follow-up visits as told by your doctor. This is important. Contact a doctor if:  You have symptoms that your doctor told you to watch for, such as: ? Headaches. ? Nausea. ? Vomiting. ? Belly (abdominal) pain. ? Dizziness. ? Light-headedness. Get help right away if:  You have: ? Very bad belly pain that does not get better with treatment. ? A very bad headache that does not get better. ? Vomiting that does not get better. ? Sudden, fast weight gain. ? Sudden swelling in your hands, ankles, or face. ? Blood in your pee. ? Blurry vision. ? Double vision. ?  Shortness of breath. ? Chest pain. ? Weakness on one side of your body. ? Trouble talking. Summary  High blood pressure is also called hypertension.  High blood pressure means that the force of your blood moving in your body is too strong.  High blood pressure can cause problems for you and your baby.  Keep all follow-up visits as told by your doctor. This is important. This information is not intended to replace advice  given to you by your health care provider. Make sure you discuss any questions you have with your health care provider. Document Released: 11/24/2010 Document Revised: 02/12/2019 Document Reviewed: 11/18/2018 Elsevier Patient Education  2020 Elsevier Inc.    Vaginal Delivery, Care After Refer to this sheet in the next few weeks. These discharge instructions provide you with information on caring for yourself after delivery. Your caregiver may also give you specific instructions. Your treatment has been planned according to the most current medical practices available, but problems sometimes occur. Call your caregiver if you have any problems or questions after you go home. HOME CARE INSTRUCTIONS 1. Take over-the-counter or prescription medicines only as directed by your caregiver or pharmacist. 2. Do not drink alcohol, especially if you are breastfeeding or taking medicine to relieve pain. 3. Do not smoke tobacco. 4. Continue to use good perineal care. Good perineal care includes: 1. Wiping your perineum from back to front 2. Keeping your perineum clean. 3. You can do sitz baths twice a day, to help keep this area clean 5. Do not use tampons, douche or have sex until your caregiver says it is okay. 6. Shower only and avoid sitting in submerged water, aside from sitz baths 7. Wear a well-fitting bra that provides breast support. 8. Eat healthy foods. 9. Drink enough fluids to keep your urine clear or pale yellow. 10. Eat high-fiber foods such as whole grain cereals and breads, brown rice, beans, and fresh fruits and vegetables every day. These foods may help prevent or relieve constipation. 11. Avoid constipation with high fiber foods or medications, such as miralax or metamucil 12. Follow your caregiver's recommendations regarding resumption of activities such as climbing stairs, driving, lifting, exercising, or traveling. 13. Talk to your caregiver about resuming sexual activities.  Resumption of sexual activities is dependent upon your risk of infection, your rate of healing, and your comfort and desire to resume sexual activity. 14. Try to have someone help you with your household activities and your newborn for at least a few days after you leave the hospital. 15. Rest as much as possible. Try to rest or take a nap when your newborn is sleeping. 16. Increase your activities gradually. 17. Keep all of your scheduled postpartum appointments. It is very important to keep your scheduled follow-up appointments. At these appointments, your caregiver will be checking to make sure that you are healing physically and emotionally. SEEK MEDICAL CARE IF:   You are passing large clots from your vagina. Save any clots to show your caregiver.  You have a foul smelling discharge from your vagina.  You have trouble urinating.  You are urinating frequently.  You have pain when you urinate.  You have a change in your bowel movements.  You have increasing redness, pain, or swelling near your vaginal incision (episiotomy) or vaginal tear.  You have pus draining from your episiotomy or vaginal tear.  Your episiotomy or vaginal tear is separating.  You have painful, hard, or reddened breasts.  You have a severe headache.  You have blurred vision or see spots.  You feel sad or depressed.  You have thoughts of hurting yourself or your newborn.  You have questions about your care, the care of your newborn, or medicines.  You are dizzy or light-headed.  You have a rash.  You have nausea or vomiting.  You were breastfeeding and have not had a menstrual period within 12 weeks after you stopped breastfeeding.  You are not breastfeeding and have not had a menstrual period by the 12th week after delivery.  You have a fever. SEEK IMMEDIATE MEDICAL CARE IF:   You have persistent pain.  You have chest pain.  You have shortness of breath.  You faint.  You have leg  pain.  You have stomach pain.  Your vaginal bleeding saturates two or more sanitary pads in 1 hour. MAKE SURE YOU:   Understand these instructions.  Will watch your condition.  Will get help right away if you are not doing well or get worse. Document Released: 10/19/2000 Document Revised: 03/08/2014 Document Reviewed: 06/18/2012 [facility] Patient Information 2015 [employer], [place]. This information is not intended to replace advice given to you by your health care provider. Make sure you discuss any questions you have with your health care provider.  Sitz Bath A sitz bath is a warm water bath taken in the sitting position. The water covers only the hips and butt (buttocks). We recommend using one that fits in the toilet, to help with ease of use and cleanliness. It may be used for either healing or cleaning purposes. Sitz baths are also used to relieve pain, itching, or muscle tightening (spasms). The water may contain medicine. Moist heat will help you heal and relax.  HOME CARE  Take 3 to 4 sitz baths a day. 18. Fill the bathtub half-full with warm water. 19. Sit in the water and open the drain a little. 20. Turn on the warm water to keep the tub half-full. Keep the water running constantly. 21. Soak in the water for 15 to 20 minutes. 22. After the sitz bath, pat the affected area dry. GET HELP RIGHT AWAY IF: You get worse instead of better. Stop the sitz baths if you get worse. MAKE SURE YOU:  Understand these instructions.  Will watch your condition.  Will get help right away if you are not doing well or get worse. Document Released: 11/29/2004 Document Revised: 07/16/2012 Document Reviewed: 02/19/2011 [facility] Patient Information 2015 [place], [place]. This information is not intended to replace advice given to you by your health care provider. Make sure you discuss any questions you have with your health care provider.

## 2019-07-12 NOTE — Progress Notes (Addendum)
CSW received consult for Jamie Maynard due to her having a flat affect. CSW spoke with Jamie Maynard via phone to complete assessment. Jamie Maynard reports this is her second child and that his name is Colton. Jamie Maynard reports that the infant will sleep in his crib and bassinet. SIDS precautions reviewed with Jamie Maynard. Jamie Maynard reports having a car seat for safe transportation of infant. Jamie Maynard reports her oldest child will be 3 in December and that she experienced no postpartum depression or anxiety after giving birth. CSW explained to Jamie Maynard about baby blues versus postpartum depression and how to seek assistance if needs arise, she stated understanding. CSW encouraged Jamie Maynard to reach out for any resources she may desire if any needs arise.  Madilyn Fireman, MSW, LCSW-A Clinical Social Worker [facility] [phone]

## 2019-07-16 ENCOUNTER — Telehealth: Payer: Medicaid Other | Admitting: Obstetrics & Gynecology

## 2019-07-17 ENCOUNTER — Other Ambulatory Visit: Payer: Self-pay

## 2019-07-17 ENCOUNTER — Telehealth (INDEPENDENT_AMBULATORY_CARE_PROVIDER_SITE_OTHER): Payer: Medicaid Other

## 2019-07-17 DIAGNOSIS — Z0130 Encounter for examination of blood pressure without abnormal findings: Secondary | ICD-10-CM

## 2019-07-17 NOTE — Progress Notes (Signed)
Pt one week post delivery, for blood pressure check 126/75 pulse 70. Has IUD, having cramping pain, using heating pad, it is helping.spoke Dr.Eure about reading, good.appointment 10-3.If continues to have pain call office back. Pad CMA

## 2019-07-22 ENCOUNTER — Other Ambulatory Visit: Payer: Self-pay

## 2019-07-22 ENCOUNTER — Ambulatory Visit: Payer: Medicaid Other | Admitting: Advanced Practice Midwife

## 2019-08-17 ENCOUNTER — Encounter: Payer: Self-pay | Admitting: Advanced Practice Midwife

## 2019-08-17 ENCOUNTER — Ambulatory Visit (INDEPENDENT_AMBULATORY_CARE_PROVIDER_SITE_OTHER): Payer: Medicaid Other | Admitting: Advanced Practice Midwife

## 2019-08-17 ENCOUNTER — Other Ambulatory Visit: Payer: Self-pay

## 2019-08-17 DIAGNOSIS — Z975 Presence of (intrauterine) contraceptive device: Secondary | ICD-10-CM

## 2019-08-17 DIAGNOSIS — Z1389 Encounter for screening for other disorder: Secondary | ICD-10-CM | POA: Diagnosis not present

## 2019-08-17 DIAGNOSIS — F53 Postpartum depression: Secondary | ICD-10-CM

## 2019-08-17 MED ORDER — ESCITALOPRAM OXALATE 10 MG PO TABS: 10.0000 mg | ORAL_TABLET | Freq: Every day | ORAL | 3 refills | Status: DC

## 2019-08-17 NOTE — Progress Notes (Signed)
Jamie Maynard is a 23 y.o. who presents for a postpartum visit. She is 5 weeks postpartum following a spontaneous vaginal delivery. I have fully reviewed the prenatal and intrapartum course. The delivery was at 66 gestational weeks. IOL for GHTN Anesthesia: epidural. Postpartum course has been uneventful Had PP IUD placed, fell out 5 days later BPs normal. . Baby's course has been uneventful. Baby is feeding by bottle. Bleeding: stopped on 10.5. Marland Kitchen Bowel function is normal. Bladder function is normal. Patient is sexually active. Contraception method is condoms. Postpartum depression screening: positive.   Current Outpatient Medications:  .  acetaminophen (TYLENOL) 325 MG tablet, Take 2 tablets (650 mg total) by mouth every 6 (six) hours as needed (for pain scale < 4). (Patient not taking: Reported on 08/17/2019), Disp:  , Rfl:  .  ibuprofen (ADVIL) 600 MG tablet, Take 1 tablet (600 mg total) by mouth every 8 (eight) hours as needed for mild pain. (Patient not taking: Reported on 08/17/2019), Disp: 30 tablet, Rfl: 0 .  meclizine (ANTIVERT) 12.5 MG tablet, Take 1 tablet (12.5 mg total) by mouth 3 (three) times daily as needed for dizziness. (Patient not taking: Reported on 07/17/2019), Disp: 30 tablet, Rfl: 0 .  ondansetron (ZOFRAN ODT) 4 MG disintegrating tablet, Take 1 tablet (4 mg total) by mouth every 6 (six) hours as needed for nausea. (Patient not taking: Reported on 07/17/2019), Disp: 30 tablet, Rfl: 2 .  prenatal vitamin w/FE, FA (PRENATAL 1 + 1) 27-1 MG TABS tablet, Take 1 tablet by mouth daily at 12 noon. (Patient not taking: Reported on 08/17/2019), Disp: 30 each, Rfl: 12  Review of Systems   Constitutional: Negative for fever and chills Eyes: Negative for visual disturbances Respiratory: Negative for shortness of breath, dyspnea Cardiovascular: Negative for chest pain or palpitations  Gastrointestinal: Negative for vomiting, diarrhea and constipation Genitourinary: Negative for dysuria  and urgency Musculoskeletal: Negative for back pain, joint pain, myalgias  Neurological: Negative for dizziness and headaches    Objective:     Vitals:   08/17/19 1537  BP: 121/75  Pulse: 100   General:  alert, cooperative and no distress   Breasts:  negative  Lungs: Normal respiratory effort  Heart:  regular rate and rhythm  Abdomen: Soft, nontender   Vulva:  normal  Vagina: normal vagina  Cervix:  closed  Corpus: Well involuted     Rectal Exam: no hemorrhoids        Assessment:    normal postpartum exam. PPD Plan:   1. Contraception: plan IUD next week. Abstinence until then. Used condom w/intercourse 10/11, so OK per CDC guidelines 2. Follow up in:  Middle to end of next week for IUD  or as needed.  3.  Refer to therapist  [facility] [phone]  Message left  Lexapro 10mg  PO rx'd

## 2019-08-24 ENCOUNTER — Encounter: Payer: Self-pay | Admitting: Women's Health

## 2019-08-24 ENCOUNTER — Ambulatory Visit (INDEPENDENT_AMBULATORY_CARE_PROVIDER_SITE_OTHER): Payer: Medicaid Other | Admitting: Women's Health

## 2019-08-24 ENCOUNTER — Other Ambulatory Visit: Payer: Self-pay

## 2019-08-24 VITALS — BP 113/77 | HR 79 | Ht 67.0 in | Wt 158.0 lb

## 2019-08-24 DIAGNOSIS — Z3202 Encounter for pregnancy test, result negative: Secondary | ICD-10-CM | POA: Diagnosis not present

## 2019-08-24 DIAGNOSIS — Z975 Presence of (intrauterine) contraceptive device: Secondary | ICD-10-CM

## 2019-08-24 DIAGNOSIS — Z30430 Encounter for insertion of intrauterine contraceptive device: Secondary | ICD-10-CM

## 2019-08-24 LAB — POCT URINE PREGNANCY: Preg Test, Ur: NEGATIVE

## 2019-08-24 MED ORDER — LEVONORGESTREL 19.5 MCG/DAY IU IUD
INTRAUTERINE_SYSTEM | Freq: Once | INTRAUTERINE | Status: AC
  Administered 2019-08-24: 17:00:00 via INTRAUTERINE

## 2019-08-24 NOTE — Patient Instructions (Signed)
 Nothing in vagina for 3 days (no sex, douching, tampons, etc...)  Check your strings once a month to make sure you can feel them, if you are not able to please let us know  If you develop a fever of 100.4 or more in the next few weeks, or if you develop severe abdominal pain, please let us know  Use a backup method of birth control, such as condoms, for 2 weeks    Intrauterine Device Insertion, Care After  This sheet gives you information about how to care for yourself after your procedure. Your health care provider may also give you more specific instructions. If you have problems or questions, contact your health care provider. What can I expect after the procedure? After the procedure, it is common to have:  Cramps and pain in the abdomen.  Light bleeding (spotting) or heavier bleeding that is like your menstrual period. This may last for up to a few days.  Lower back pain.  Dizziness.  Headaches.  Nausea. Follow these instructions at home:  Before resuming sexual activity, check to make sure that you can feel the IUD string(s). You should be able to feel the end of the string(s) below the opening of your cervix. If your IUD string is in place, you may resume sexual activity. ? If you had a hormonal IUD inserted more than 7 days after your most recent period started, you will need to use a backup method of birth control for 7 days after IUD insertion. Ask your health care provider whether this applies to you.  Continue to check that the IUD is still in place by feeling for the string(s) after every menstrual period, or once a month.  Take over-the-counter and prescription medicines only as told by your health care provider.  Do not drive or use heavy machinery while taking prescription pain medicine.  Keep all follow-up visits as told by your health care provider. This is important. Contact a health care provider if:  You have bleeding that is heavier or lasts longer than  a normal menstrual cycle.  You have a fever.  You have cramps or abdominal pain that get worse or do not get better with medicine.  You develop abdominal pain that is new or is not in the same area of earlier cramping and pain.  You feel lightheaded or weak.  You have abnormal or bad-smelling discharge from your vagina.  You have pain during sexual activity.  You have any of the following problems with your IUD string(s): ? The string bothers or hurts you or your sexual partner. ? You cannot feel the string. ? The string has gotten longer.  You can feel the IUD in your vagina.  You think you may be pregnant, or you miss your menstrual period.  You think you may have an STI (sexually transmitted infection). Get help right away if:  You have flu-like symptoms.  You have a fever and chills.  You can feel that your IUD has slipped out of place. Summary  After the procedure, it is common to have cramps and pain in the abdomen. It is also common to have light bleeding (spotting) or heavier bleeding that is like your menstrual period.  Continue to check that the IUD is still in place by feeling for the string(s) after every menstrual period, or once a month.  Keep all follow-up visits as told by your health care provider. This is important.  Contact your health care provider if   you have problems with your IUD string(s), such as the string getting longer or bothering you or your sexual partner. This information is not intended to replace advice given to you by your health care provider. Make sure you discuss any questions you have with your health care provider. Document Released: 06/20/2011 Document Revised: 10/04/2017 Document Reviewed: 09/12/2016 Elsevier Patient Education  2020 Elsevier Inc[employer]  

## 2019-08-24 NOTE — Addendum Note (Signed)
Addended by: Linton Rump on: 08/24/2019 04:46 PM   Modules accepted: Orders

## 2019-08-24 NOTE — Progress Notes (Signed)
   IUD INSERTION Patient name: Jamie Maynard MRN [id number]  Date of birth: 1996-07-20 Subjective Findings:   Jamie Maynard is a 23 y.o. [id number] Caucasian female being seen today for insertion of a Liletta IUD. Had postplacental IUD that fell out. SVB 07/10/19.   Patient's last menstrual period was 08/24/2019. Last sexual intercourse was 10/11, started period today Last pap just turned 23yo yesterday. Results were:  n/a  The risks and benefits of the method and placement have been thouroughly reviewed with the patient and all questions were answered.  Specifically the patient is aware of failure rate of 11/998, expulsion of the IUD and of possible perforation.  The patient is aware of irregular bleeding due to the method and understands the incidence of irregular bleeding diminishes with time.  Signed copy of informed consent in chart.  Pertinent History Reviewed:   Reviewed past medical,surgical, social, obstetrical and family history.  Reviewed problem list, medications and allergies. Objective Findings & Procedure:   Vitals:   08/24/19 1523  BP: 113/77  Pulse: 79  Weight: 158 lb (71.7 kg)  Height: 5\' 7"  (1.702 m)  Body mass index is 24.75 kg/m.  Results for orders placed or performed in visit on 08/24/19 (from the past 24 hour(s))  POCT urine pregnancy   Collection Time: 08/24/19  3:27 PM  Result Value Ref Range   Preg Test, Ur Negative Negative     Time out was performed.  A graves speculum was placed in the vagina.  The cervix was visualized, prepped using Betadine, and grasped with a single tooth tenaculum. The uterus was found to be neutral and it sounded to 9 cm.  Liletta  IUD placed per manufacturer's recommendations. The strings were trimmed to approximately 3 cm. The patient tolerated the procedure well.   Informal transvaginal sonogram was performed and the proper placement of the IUD was verified. Assessment & Plan:   1) Liletta IUD insertion The patient was  given post procedure instructions, including signs and symptoms of infection and to check for the strings after each menses or each month, and refraining from intercourse or anything in the vagina for 3 days. She was given a care card with date IUD placed, and date IUD to be removed. She is scheduled for a f/u appointment in 4 weeks.  Orders Placed This Encounter  Procedures  . POCT urine pregnancy    Return in about 4 weeks (around 09/21/2019) for Pap & physical, f/u IUD and meds.  [place], [facility] 08/24/2019 3:59 PM

## 2019-09-08 ENCOUNTER — Telehealth: Payer: Self-pay | Admitting: *Deleted

## 2019-09-08 NOTE — Telephone Encounter (Signed)
Pt is having bleeding with IUD. She got it placed on 08/24/19. I advised irregular bleeding can be common for the first 6 months after getting an IUD and then usually the period stops completely. Pt voiced understanding. [place]

## 2019-09-08 NOTE — Telephone Encounter (Signed)
Left message @ 12:57 pm. JSY 

## 2019-09-08 NOTE — Telephone Encounter (Signed)
Pt left message that she is bleeding on the iud. She wants to know if this is normal?

## 2019-09-16 ENCOUNTER — Telehealth: Payer: Self-pay | Admitting: Women's Health

## 2019-09-16 NOTE — Telephone Encounter (Signed)
Pt called and stated that the Lexapro was making her depression worse and that she is having suicidal thoughts. Spoke with the nurse, Tish and Tish advised for pt to go to the hospital to be seen. I advised pt per nurse request to go to the hospital. Pt stated that she only has pregnancy medicaid and would have to pay out of pocket if she goes to the hospital. Encouraged pt to please go to the hospital to be evaluated for suicidal thoughts. Pt verbalized understanding.

## 2019-10-13 ENCOUNTER — Other Ambulatory Visit: Payer: Medicaid Other | Admitting: Women's Health

## 2020-01-06 ENCOUNTER — Telehealth: Payer: Self-pay | Admitting: Obstetrics & Gynecology

## 2020-01-06 NOTE — Telephone Encounter (Signed)

## 2020-01-07 ENCOUNTER — Ambulatory Visit (INDEPENDENT_AMBULATORY_CARE_PROVIDER_SITE_OTHER): Payer: Medicaid Other | Admitting: Advanced Practice Midwife

## 2020-01-07 ENCOUNTER — Encounter: Payer: Self-pay | Admitting: Advanced Practice Midwife

## 2020-01-07 ENCOUNTER — Other Ambulatory Visit: Payer: Self-pay

## 2020-01-07 VITALS — BP 130/82 | HR 98 | Ht 67.0 in | Wt 170.5 lb

## 2020-01-07 DIAGNOSIS — Z3202 Encounter for pregnancy test, result negative: Secondary | ICD-10-CM

## 2020-01-07 DIAGNOSIS — Z30431 Encounter for routine checking of intrauterine contraceptive device: Secondary | ICD-10-CM | POA: Diagnosis not present

## 2020-01-07 LAB — POCT URINE PREGNANCY: Preg Test, Ur: NEGATIVE

## 2020-01-07 NOTE — Addendum Note (Signed)
Addended by: Colen Darling on: 01/07/2020 10:29 AM   Modules accepted: Orders
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
## 2020-03-04 ENCOUNTER — Ambulatory Visit (INDEPENDENT_AMBULATORY_CARE_PROVIDER_SITE_OTHER): Payer: Medicaid Other | Admitting: Obstetrics & Gynecology

## 2020-03-04 ENCOUNTER — Other Ambulatory Visit: Payer: Self-pay

## 2020-03-04 ENCOUNTER — Encounter: Payer: Self-pay | Admitting: Obstetrics & Gynecology

## 2020-03-04 VITALS — BP 105/72 | HR 98 | Ht 67.0 in | Wt 167.0 lb

## 2020-03-04 DIAGNOSIS — Z3009 Encounter for other general counseling and advice on contraception: Secondary | ICD-10-CM | POA: Diagnosis not present

## 2020-03-04 NOTE — Progress Notes (Signed)
Preoperative History and Physical  Jamie Maynard is a 24 y.o. [phone] with Patient's last menstrual period was 02/19/2020 (approximate). admitted for a laparoscopic bilateral tubal ligation.    PMH:    Past Medical History:  Diagnosis Date  . Medical history non-contributory     PSH:     Past Surgical History:  Procedure Laterality Date  . TONSILLECTOMY AND ADENOIDECTOMY Bilateral 2003   Performed at [facility]    POb/GynH:      OB History    Gravida  2   Para  2   Term  2   Preterm      AB      Living  2     SAB      TAB      Ectopic      Multiple  0   Live Births  2           SH:   Social History   Tobacco Use  . Smoking status: Never Smoker  . Smokeless tobacco: Never Used  Substance Use Topics  . Alcohol use: No  . Drug use: No    FH:    Family History  Problem Relation Age of Onset  . Depression Father   . Stroke Father   . Hypertension Father   . Heart disease Father   . Heart attack Maternal Grandfather   . Congestive Heart Failure Paternal Grandmother   . Migraines Paternal Grandmother   . COPD Paternal Grandmother   . Lung cancer Paternal Grandfather   . Autism Cousin        2 paternal first cousins have autism  . ADD / ADHD Cousin        Several paternal first cousins have ADHD/ADD     Allergies:  Allergies  Allergen Reactions  . Other Hives and Rash    Glitter make-up causes rash and hives     Medications:       Current Outpatient Medications:  .  acetaminophen (TYLENOL) 325 MG tablet, Take 2 tablets (650 mg total) by mouth every 6 (six) hours as needed (for pain scale < 4)., Disp:  , Rfl:  .  levonorgestrel (LILETTA, 52 MG,) 19.5 MCG/DAY IUD IUD, 1 each by Intrauterine route once., Disp: , Rfl:   Review of Systems:   Review of Systems  Constitutional: Negative for fever, chills, weight loss, malaise/fatigue and diaphoresis.  HENT: Negative for hearing loss, ear pain, nosebleeds, congestion,  sore throat, neck pain, tinnitus and ear discharge.   Eyes: Negative for blurred vision, double vision, photophobia, pain, discharge and redness.  Respiratory: Negative for cough, hemoptysis, sputum production, shortness of breath, wheezing and stridor.   Cardiovascular: Negative for chest pain, palpitations, orthopnea, claudication, leg swelling and PND.  Gastrointestinal: Positive for abdominal pain. Negative for heartburn, nausea, vomiting, diarrhea, constipation, blood in stool and melena.  Genitourinary: Negative for dysuria, urgency, frequency, hematuria and flank pain.  Musculoskeletal: Negative for myalgias, back pain, joint pain and falls.  Skin: Negative for itching and rash.  Neurological: Negative for dizziness, tingling, tremors, sensory change, speech change, focal weakness, seizures, loss of consciousness, weakness and headaches.  Endo/Heme/Allergies: Negative for environmental allergies and polydipsia. Does not bruise/bleed easily.  Psychiatric/Behavioral: Negative for depression, suicidal ideas, hallucinations, memory loss and substance abuse. The patient is not nervous/anxious and does not have insomnia.      PHYSICAL EXAM:  Blood pressure 105/72, pulse 98, height 5\' 7"  (1.702 m), weight 167 lb (75.8 kg), last  menstrual period 02/19/2020, not currently breastfeeding.    Vitals reviewed. Constitutional: She is oriented to person, place, and time. She appears well-developed and well-nourished.  HENT:  Head: Normocephalic and atraumatic.  Right Ear: External ear normal.  Left Ear: External ear normal.  Nose: Nose normal.  Mouth/Throat: Oropharynx is clear and moist.  Eyes: Conjunctivae and EOM are normal. Pupils are equal, round, and reactive to light. Right eye exhibits no discharge. Left eye exhibits no discharge. No scleral icterus.  Neck: Normal range of motion. Neck supple. No tracheal deviation present. No thyromegaly present.  Cardiovascular: Normal rate, regular  rhythm, normal heart sounds and intact distal pulses.  Exam reveals no gallop and no friction rub.   No murmur heard. Respiratory: Effort normal and breath sounds normal. No respiratory distress. She has no wheezes. She has no rales. She exhibits no tenderness.  GI: Soft. Bowel sounds are normal. She exhibits no distension and no mass. There is tenderness. There is no rebound and no guarding.  Genitourinary:       Vulva is normal without lesions Vagina is pink moist without discharge Cervix normal in appearance and pap is normal Uterus is normal size, contour, position, consistency, mobility, non-tender Adnexa is negative with normal sized ovaries by sonogram  Musculoskeletal: Normal range of motion. She exhibits no edema and no tenderness.  Neurological: She is alert and oriented to person, place, and time. She has normal reflexes. She displays normal reflexes. No cranial nerve deficit. She exhibits normal muscle tone. Coordination normal.  Skin: Skin is warm and dry. No rash noted. No erythema. No pallor.  Psychiatric: She has a normal mood and affect. Her behavior is normal. Judgment and thought content normal.    Labs: No results found for this or any previous visit (from the past 336 hour(s)).  EKG: Orders placed or performed during the hospital encounter of 05/04/18  . ED EKG  . EKG 12-Lead  . EKG 12-Lead  . ED EKG  . EKG    Imaging Studies: No results found.    Assessment: Multiparous female desires permanent sterilization 24 years old, so will use a non salpingectomy technique   Patient Active Problem List   Diagnosis Date Noted  . IUD (intrauterine device) in place 08/17/2019  . Postpartum depression 08/17/2019  . Gestational hypertension 07/10/2019    Plan: Laparoscopic bilateral tubal ligation, non salpingectomy which is my decision 04/06/20  Florian Buff 03/04/2020 12:28 PM       Face to face time:  15 minutes  Greater than 50% of the visit time  was spent in counseling and coordination of care with the patient.  The summary and outline of the counseling and care coordination is summarized in the note above.   All questions were answered.

## 2020-04-05 ENCOUNTER — Other Ambulatory Visit (HOSPITAL_COMMUNITY): Payer: Medicaid Other

## 2020-04-05 ENCOUNTER — Encounter (HOSPITAL_COMMUNITY): Admission: RE | Admit: 2020-04-05 | Payer: Medicaid Other | Source: Ambulatory Visit

## 2020-04-10 DIAGNOSIS — R200 Anesthesia of skin: Secondary | ICD-10-CM | POA: Insufficient documentation

## 2020-04-10 DIAGNOSIS — R112 Nausea with vomiting, unspecified: Secondary | ICD-10-CM | POA: Diagnosis present

## 2020-04-11 ENCOUNTER — Emergency Department (HOSPITAL_COMMUNITY)
Admission: EM | Admit: 2020-04-11 | Discharge: 2020-04-11 | Disposition: A | Discharge status: Home / Self Care | Payer: Medicaid Other | Attending: Emergency Medicine | Admitting: Emergency Medicine

## 2020-04-11 ENCOUNTER — Encounter (HOSPITAL_COMMUNITY): Payer: Self-pay | Admitting: Emergency Medicine

## 2020-04-11 ENCOUNTER — Other Ambulatory Visit: Payer: Self-pay

## 2020-04-11 DIAGNOSIS — R112 Nausea with vomiting, unspecified: Secondary | ICD-10-CM

## 2020-04-11 LAB — COMPREHENSIVE METABOLIC PANEL
ALT: 22 U/L (ref 0–44)
AST: 16 U/L (ref 15–41)
Albumin: 4.6 g/dL (ref 3.5–5.0)
Alkaline Phosphatase: 64 U/L (ref 38–126)
Anion gap: 10 (ref 5–15)
BUN: 15 mg/dL (ref 6–20)
CO2: 24 mmol/L (ref 22–32)
Calcium: 9.6 mg/dL (ref 8.9–10.3)
Chloride: 104 mmol/L (ref 98–111)
Creatinine, Ser: 0.67 mg/dL (ref 0.44–1.00)
GFR calc Af Amer: >60 mL/min (ref >60)
GFR calc non Af Amer: >60 mL/min (ref >60)
Glucose, Bld: 92 mg/dL (ref 70–99)
Potassium: 3.6 mmol/L (ref 3.5–5.1)
Sodium: 138 mmol/L (ref 135–145)
Total Bilirubin: 0.4 mg/dL (ref 0.3–1.2)
Total Protein: 7.6 g/dL (ref 6.5–8.1)

## 2020-04-11 LAB — URINALYSIS, ROUTINE W REFLEX MICROSCOPIC
Bacteria, UA: NONE SEEN
Bilirubin Urine: NEGATIVE
Glucose, UA: NEGATIVE mg/dL
Ketones, ur: NEGATIVE mg/dL
Leukocytes,Ua: NEGATIVE
Nitrite: NEGATIVE
Protein, ur: NEGATIVE mg/dL
Specific Gravity, Urine: 1.025 (ref 1.005–1.030)
pH: 5 (ref 5.0–8.0)

## 2020-04-11 LAB — CBC
HCT: 43 % (ref 36.0–46.0)
Hemoglobin: 14.3 g/dL (ref 12.0–15.0)
MCH: 29.5 pg (ref 26.0–34.0)
MCHC: 33.3 g/dL (ref 30.0–36.0)
MCV: 88.8 fL (ref 80.0–100.0)
Platelets: 295 10*3/uL (ref 150–400)
RBC: 4.84 MIL/uL (ref 3.87–5.11)
RDW: 12.7 % (ref 11.5–15.5)
WBC: 7.5 10*3/uL (ref 4.0–10.5)
nRBC: 0 % (ref 0.0–0.2)

## 2020-04-11 LAB — LIPASE, BLOOD: Lipase: 27 U/L (ref 11–51)

## 2020-04-11 LAB — POC URINE PREG, ED: Preg Test, Ur: NEGATIVE

## 2020-04-11 MED ORDER — SODIUM CHLORIDE 0.9 % IV BOLUS
1000.0000 mL | Freq: Once | INTRAVENOUS | Status: AC
  Administered 2020-04-11: 1000 mL via INTRAVENOUS

## 2020-04-11 MED ORDER — ONDANSETRON HCL 4 MG PO TABS: 4.0000 mg | ORAL_TABLET | Freq: Four times a day (QID) | ORAL | 0 refills | Status: DC | PRN

## 2020-04-11 MED ORDER — ONDANSETRON HCL 4 MG/2ML IJ SOLN
4.0000 mg | Freq: Once | INTRAMUSCULAR | Status: AC
  Administered 2020-04-11: 4 mg via INTRAVENOUS
  Filled 2020-04-11: qty 2

## 2020-04-11 NOTE — ED Provider Notes (Signed)
[facility] EMERGENCY DEPARTMENT Provider Note   CSN: [id number] Arrival date & time: 04/10/20  2330   History Chief Complaint  Patient presents with  . Emesis    Jamie Maynard is a 24 y.o. female.  The history is provided by the patient.  Emesis She has history of gestational hypertension and comes in because of nausea, body feeling numb.  She states that she started having nausea and vomiting at yesterday and that she felt lightheaded and her body felt numb.  She has not had any more vomiting but continues to have nausea and body continues to feel numb.  She denies fever, chills, sweats.  She denies abdominal pain.  She denies constipation or diarrhea.  She denies urinary urgency, frequency, tenesmus, dysuria.  She denies any sick contacts.  She is currently on her menses and has an IUD for contraception.  Past Medical History:  Diagnosis Date  . Medical history non-contributory     Patient Active Problem List   Diagnosis Date Noted  . IUD (intrauterine device) in place 08/17/2019  . Postpartum depression 08/17/2019  . Gestational hypertension 07/10/2019    Past Surgical History:  Procedure Laterality Date  . TONSILLECTOMY AND ADENOIDECTOMY Bilateral 2003   Performed at [facility]     OB History    Gravida  2   Para  2   Term  2   Preterm      AB      Living  2     SAB      TAB      Ectopic      Multiple  0   Live Births  2           Family History  Problem Relation Age of Onset  . Depression Father   . Stroke Father   . Hypertension Father   . Heart disease Father   . Heart attack Maternal Grandfather   . Congestive Heart Failure Paternal Grandmother   . Migraines Paternal Grandmother   . COPD Paternal Grandmother   . Lung cancer Paternal Grandfather   . Autism Cousin        2 paternal first cousins have autism  . ADD / ADHD Cousin        Several paternal first cousins have ADHD/ADD    Social History   Tobacco  Use  . Smoking status: Never Smoker  . Smokeless tobacco: Never Used  Substance Use Topics  . Alcohol use: No  . Drug use: No    Home Medications Prior to Admission medications   Medication Sig Start Date End Date Taking? Authorizing Provider  levonorgestrel (LILETTA, 52 MG,) 19.5 MCG/DAY IUD IUD 1 each by Intrauterine route once.    [provider]  sertraline (ZOLOFT) 100 MG tablet Take 100 mg by mouth daily.    [provider]    Allergies    Other  Review of Systems   Review of Systems  Gastrointestinal: Positive for vomiting.  All other systems reviewed and are negative.   Physical Exam Updated Vital Signs BP 132/87   Pulse 98   Temp 98.6 F (37 C)   Resp 18   Ht 5\' 7"  (1.702 m)   Wt 72.6 kg   LMP 03/28/2020   SpO2 99%   BMI 25.06 kg/m   Physical Exam Vitals and nursing note reviewed.   24 year old female, resting comfortably and in no acute distress. Vital signs are normal. Oxygen saturation is 99%,  which is normal. Head is normocephalic and atraumatic. PERRLA, EOMI. Oropharynx is clear. Neck is nontender and supple without adenopathy or JVD. Back is nontender and there is no CVA tenderness. Lungs are clear without rales, wheezes, or rhonchi. Chest is nontender. Heart has regular rate and rhythm without murmur. Abdomen is soft, flat, nontender without masses or hepatosplenomegaly and peristalsis is hypoactive. Extremities have no cyanosis or edema, full range of motion is present. Skin is warm and dry without rash. Neurologic: Mental status is normal, cranial nerves are intact, there are no motor or sensory deficits.  ED Results / Procedures / Treatments   Labs (all labs ordered are listed, but only abnormal results are displayed) Labs Reviewed  URINALYSIS, ROUTINE W REFLEX MICROSCOPIC - Abnormal; Notable for the following components:      Result Value   APPearance HAZY (*)    Hgb urine dipstick LARGE (*)    All other components  within normal limits  LIPASE, BLOOD  COMPREHENSIVE METABOLIC PANEL  CBC  POC URINE PREG, ED   Procedures Procedures   Medications Ordered in ED Medications  sodium chloride 0.9 % bolus 1,000 mL (has no administration in time range)  ondansetron (ZOFRAN) injection 4 mg (has no administration in time range)    ED Course  I have reviewed the triage vital signs and the nursing notes.  Pertinent lab results that were available during my care of the patient were reviewed by me and considered in my medical decision making (see chart for details).  MDM Rules/Calculators/A&P Nausea with malaise.  Pattern is most suggestive of viral gastritis.  No red flags to suggest more serious pathology.  Labs are reassuring.  We will give IV fluids and ondansetron and reassess.  Old records are reviewed, and she has no relevant past visits.  She feels much better after above-noted treatment.  She is discharged with prescription for ondansetron.  Final Clinical Impression(s) / ED Diagnoses Final diagnoses:  Non-intractable vomiting with nausea, unspecified vomiting type    Rx / DC Orders ED Discharge Orders         Ordered    ondansetron (ZOFRAN) 4 MG tablet  Every 6 hours PRN     04/11/20 0519           Dione Booze, MD 04/11/20 [phone]

## 2020-04-11 NOTE — ED Notes (Signed)
Pt offered PO Fluids. Pt tolerating PO Fluids at this time without complication.

## 2020-04-11 NOTE — ED Notes (Signed)
ED Provider at bedside. 

## 2020-04-11 NOTE — ED Triage Notes (Signed)
Pt c/o emesis, weakness, and chills x 1 day.

## 2020-04-11 NOTE — Discharge Instructions (Addendum)
Return if you are having any problems. 

## 2020-04-15 ENCOUNTER — Telehealth: Payer: Medicaid Other | Admitting: Obstetrics & Gynecology

## 2020-04-20 ENCOUNTER — Encounter: Payer: Self-pay | Admitting: Advanced Practice Midwife

## 2020-04-20 ENCOUNTER — Ambulatory Visit (INDEPENDENT_AMBULATORY_CARE_PROVIDER_SITE_OTHER): Payer: Medicaid Other | Admitting: Advanced Practice Midwife

## 2020-04-20 VITALS — BP 120/78 | HR 82 | Ht 67.0 in | Wt 172.0 lb

## 2020-04-20 DIAGNOSIS — Z30431 Encounter for routine checking of intrauterine contraceptive device: Secondary | ICD-10-CM | POA: Diagnosis not present

## 2020-04-20 NOTE — Progress Notes (Signed)
   GYN VISIT Patient name: Jamie Maynard MRN [id number]  Date of birth: [birth year] Chief Complaint:   IUD check (stomach pain x 1 month)  History of Present Illness:   Jamie Maynard is a 24 y.o. G23P2002 Caucasian female being seen today for irreg bleeding/spotting x 1 month. Had Liletta IUD placed Oct 2020. Also reports sharp bilat low abd discomfort intermittently. No fever, syncopal episodes, or abnl vag d/c. She has a BTL scheduled for July 28th and would prefer to keep her IUD in place until then, as long as it is positioned correctly.   Depression screen [facility] 2/9 03/25/2019 01/12/2019 12/22/2018  Decreased Interest 3 0 0  Down, Depressed, Hopeless 2 0 0  PHQ - 2 Score 5 0 0  Altered sleeping 3 0 -  Tired, decreased energy 2 0 -  Change in appetite 3 0 -  Feeling bad or failure about yourself  3 0 -  Trouble concentrating 0 0 -  Moving slowly or fidgety/restless 2 0 -  Suicidal thoughts 0 0 -  PHQ-9 Score 18 0 -  Difficult doing work/chores Somewhat difficult - -    Patient's last menstrual period was 03/28/2020. The current method of family planning is IUD.  Last pap age 33.  Review of Systems:   Pertinent items are noted in HPI Denies fever/chills, dizziness, headaches, visual disturbances, fatigue, shortness of breath, chest pain, abdominal pain, vomiting, abnormal vaginal discharge/itching/odor/irritation, problems with periods, bowel movements, urination, or intercourse unless otherwise stated above.  Pertinent History Reviewed:  Reviewed past medical,surgical, social, obstetrical and family history.  Reviewed problem list, medications and allergies. Physical Assessment:   Vitals:   04/20/20 1105  BP: 120/78  Pulse: 82  Weight: 172 lb (78 kg)  Height: 5\' 7"  (1.702 m)  Body mass index is 26.94 kg/m.       Physical Examination:   General appearance: alert, well appearing, and in no distress  Mental status: alert, oriented to person, place, and time  Skin: warm  & dry   Cardiovascular: normal heart rate noted  Respiratory: normal respiratory effort, no distress  Abdomen: soft, non-tender   Pelvic: normal external genitalia, vulva, vagina, cervix, uterus and adnexa; IUD stings visualized, approx 2-3cm in length and tucked behind cx  Extremities: no edema   Bedside u/s: IUD positioned in fundus  Chaperone: Peggy Dones    No results found for this or any previous visit (from the past 24 hour(s)).  Assessment & Plan:  1) Irreg vag bleeding with IUD> encouraged to take ibuprofen 600mg  q 6h around the clock for bleeding/discomfort; declined removal  2) Plan for BTL in July> will leave IUD in until then  Meds: No orders of the defined types were placed in this encounter.   No orders of the defined types were placed in this encounter.   Return for Keep BTL as scheduled.  CNM 04/20/2020 1:45 PM

## 2020-05-16 ENCOUNTER — Telehealth: Payer: Medicaid Other | Admitting: Obstetrics & Gynecology

## 2020-05-18 ENCOUNTER — Other Ambulatory Visit: Payer: Self-pay

## 2020-05-18 ENCOUNTER — Ambulatory Visit
Admission: EM | Admit: 2020-05-18 | Discharge: 2020-05-18 | Disposition: A | Discharge status: Home / Self Care | Payer: Medicaid Other | Attending: Emergency Medicine | Admitting: Emergency Medicine

## 2020-05-18 ENCOUNTER — Ambulatory Visit (INDEPENDENT_AMBULATORY_CARE_PROVIDER_SITE_OTHER): Payer: Medicaid Other

## 2020-05-18 DIAGNOSIS — W109XXA Fall (on) (from) unspecified stairs and steps, initial encounter: Secondary | ICD-10-CM | POA: Diagnosis not present

## 2020-05-18 DIAGNOSIS — M545 Low back pain, unspecified: Secondary | ICD-10-CM

## 2020-05-18 DIAGNOSIS — W19XXXA Unspecified fall, initial encounter: Secondary | ICD-10-CM

## 2020-05-18 LAB — POCT URINE PREGNANCY: Preg Test, Ur: NEGATIVE

## 2020-05-18 MED ORDER — CYCLOBENZAPRINE HCL 10 MG PO TABS: 10.0000 mg | ORAL_TABLET | Freq: Every day | ORAL | 0 refills | Status: DC

## 2020-05-18 MED ORDER — NAPROXEN 500 MG PO TABS: 500.0000 mg | ORAL_TABLET | Freq: Two times a day (BID) | ORAL | 0 refills | Status: DC

## 2020-05-18 NOTE — Discharge Instructions (Signed)
X-rays negative for fracture or dislocation Continue conservative management of rest, ice, and gentle stretches Take naproxen as needed for pain relief (may cause abdominal discomfort, ulcers, and GI bleeds avoid taking with other NSAIDs) Take cyclobenzaprine at nighttime for symptomatic relief. Avoid driving or operating heavy machinery while using medication. Follow up with PCP if symptoms persist Return or go to the ER if you have any new or worsening symptoms (fever, chills, chest pain, abdominal pain, changes in bowel or bladder habits, pain radiating into lower legs, etc...)

## 2020-05-18 NOTE — ED Provider Notes (Signed)
[facility]   [id number] 05/18/20 Arrival Time: 1411  CC: Back PAIN  SUBJECTIVE: History from: patient. Jamie Maynard is a 24 y.o. female complains of low back pain x 10 minutes.  Fall backwards down 4 steps landing on low back.  Localizes the pain to the low back.  Describes the pain as intermittent.  Has NOT tried OTC medications.  Symptoms are made worse with standing up straight.  Denies similar symptoms in the past.  Denies fever, chills, erythema, ecchymosis, effusion, weakness, numbness and tingling, saddle paresthesias, loss of bowel or bladder function.      ROS: As per HPI.  All other pertinent ROS negative.     Past Medical History:  Diagnosis Date  . Medical history non-contributory    Past Surgical History:  Procedure Laterality Date  . TONSILLECTOMY AND ADENOIDECTOMY Bilateral 2003   Performed at [facility]   Allergies  Allergen Reactions  . Other Hives and Rash    Glitter make-up causes rash and hives    No current facility-administered medications on file prior to encounter.   Current Outpatient Medications on File Prior to Encounter  Medication Sig Dispense Refill  . levonorgestrel (LILETTA, 52 MG,) 19.5 MCG/DAY IUD IUD 1 each by Intrauterine route once.     Social History   Socioeconomic History  . Marital status: Single    Spouse name: Not on file  . Number of children: 2  . Years of education: Not on file  . Highest education level: Not on file  Occupational History  . Not on file  Tobacco Use  . Smoking status: Never Smoker  . Smokeless tobacco: Never Used  Vaping Use  . Vaping Use: Never used  Substance and Sexual Activity  . Alcohol use: No  . Drug use: No  . Sexual activity: Not Currently    Birth control/protection: I.U.D.  Other Topics Concern  . Not on file  Social History Narrative    She lives with her father, 70 year old brother, and 30 year old daughter      Social Determinants of Health    Financial Resource Strain: Low Risk   . Difficulty of Paying Living Expenses: Not hard at all  Food Insecurity: Unknown  . Worried About Programme researcher, broadcasting/film/video in the Last Year: Never true  . Ran Out of Food in the Last Year: Not on file  Transportation Needs:   . Lack of Transportation (Medical):   Marland Kitchen Lack of Transportation (Non-Medical):   Physical Activity:   . Days of Exercise per Week:   . Minutes of Exercise per Session:   Stress:   . Feeling of Stress :   Social Connections:   . Frequency of Communication with Friends and Family:   . Frequency of Social Gatherings with Friends and Family:   . Attends Religious Services:   . Active Member of Clubs or Organizations:   . Attends Banker Meetings:   Marland Kitchen Marital Status:   Intimate Partner Violence:   . Fear of Current or Ex-Partner:   . Emotionally Abused:   Marland Kitchen Physically Abused:   . Sexually Abused:    Family History  Problem Relation Age of Onset  . Depression Father   . Stroke Father   . Hypertension Father   . Heart disease Father   . Heart attack Maternal Grandfather   . Congestive Heart Failure Paternal Grandmother   . Migraines Paternal Grandmother   . COPD Paternal Grandmother   .  Lung cancer Paternal Grandfather   . Autism Cousin        2 paternal first cousins have autism  . ADD / ADHD Cousin        Several paternal first cousins have ADHD/ADD    OBJECTIVE:  Vitals:   05/18/20 1424  BP: 110/72  Pulse: (!) 103  Resp: 17  Temp: 98.7 F (37.1 C)  TempSrc: Tympanic  SpO2: 98%    General appearance: ALERT; in no acute distress.  Head: NCAT Lungs: Normal respiratory effort; CTAB CV: RRR Musculoskeletal: Back  Inspection: superficial scrapes and abrasions over lumbar spine Palpation: TTP over lumbar spine ROM: FROM active and passive Strength: 5/5 shld abduction, 5/5 shld adduction, 5/5 elbow flexion, 5/5 elbow extension, 5/5 grip strength, 5/5 hip flexion, 5/5 hip extension Skin: warm  and dry Neurologic: Sitting in wheelchair, ambulates during x-ray examination; Sensation intact about the upper/ lower extremities Psychological: alert and cooperative; normal mood and affect  DIAGNOSTIC STUDIES:  DG Lumbar Spine Complete  Result Date: 05/18/2020 CLINICAL DATA:  Low back pain after fall today down stairs. EXAM: LUMBAR SPINE - COMPLETE 4+ VIEW COMPARISON:  None. FINDINGS: There is no evidence of lumbar spine fracture. Alignment is normal. Intervertebral disc spaces are maintained. IMPRESSION: Negative. Electronically Signed   By: Lupita Raider M.D.   On: 05/18/2020 14:45    X-rays negative for bony abnormalities including fracture, or dislocation.    I have reviewed the x-rays myself and the radiologist interpretation. I am in agreement with the radiologist interpretation.     ASSESSMENT & PLAN:  1. Acute midline low back pain without sciatica   2. Fall, initial encounter     Meds ordered this encounter  Medications  . naproxen (NAPROSYN) 500 MG tablet    Sig: Take 1 tablet (500 mg total) by mouth 2 (two) times daily.    Dispense:  30 tablet    Refill:  0    Order Specific Question:   Supervising Provider    Answer:   Eustace Moore [[id number]]  . cyclobenzaprine (FLEXERIL) 10 MG tablet    Sig: Take 1 tablet (10 mg total) by mouth at bedtime.    Dispense:  15 tablet    Refill:  0    Order Specific Question:   Supervising Provider    Answer:   Eustace Moore [[id number]]   X-rays negative for fracture or dislocation Continue conservative management of rest, ice, and gentle stretches Take naproxen as needed for pain relief (may cause abdominal discomfort, ulcers, and GI bleeds avoid taking with other NSAIDs) Take cyclobenzaprine at nighttime for symptomatic relief. Avoid driving or operating heavy machinery while using medication. Follow up with PCP if symptoms persist Return or go to the ER if you have any new or worsening symptoms (fever, chills, chest  pain, abdominal pain, changes in bowel or bladder habits, pain radiating into lower legs, etc...)   Reviewed expectations re: course of current medical issues. Questions answered. Outlined signs and symptoms indicating need for more acute intervention. Patient verbalized understanding. After Visit Summary given.    Rennis Harding, PA-C 05/18/20 1502

## 2020-05-18 NOTE — ED Triage Notes (Signed)
Pt presents with complaints of slipping and falling down 4 steps and hurting her lower back. Denies any head trauma.

## 2020-05-23 ENCOUNTER — Ambulatory Visit: Payer: Medicaid Other | Admitting: Obstetrics and Gynecology

## 2020-05-26 NOTE — Patient Instructions (Signed)
Jamie Maynard  05/26/2020     @   Your procedure is scheduled on  06/01/2020.  Report to [facility] at  0700 A.M.  Call this number if you have problems the morning of surgery:  [phone]   Remember:  Do not eat or drink after midnight.                       Take these medicines the morning of surgery with A SIP OF WATER  None    Do not wear jewelry, make-up or nail polish.  Do not wear lotions, powders, or perfumes. Please wear deodorant and brush your teeth.  Do not shave 48 hours prior to surgery.  Men may shave face and neck.  Do not bring valuables to the hospital.  [facility] is not responsible for any belongings or valuables.  Contacts, dentures or bridgework may not be worn into surgery.  Leave your suitcase in the car.  After surgery it may be brought to your room.  For patients admitted to the hospital, discharge time will be determined by your treatment team.  Patients discharged the day of surgery will not be allowed to drive home.   Name and phone number of your driver:   family Special instructions:  DO NOT smoke the morning of your procedure.  Please read over the following fact sheets that you were given. Anesthesia Post-op Instructions and Care and Recovery After Surgery       Salpingectomy, Care After This sheet gives you information about how to care for yourself after your procedure. Your health care provider may also give you more specific instructions. If you have problems or questions, contact your health care provider. What can I expect after the procedure? After the procedure, it is common to have:  Pain in your abdomen.  Some light vaginal bleeding (spotting) for a few days.  Tiredness. Your recovery time will vary depending on which method your surgeon used for your surgery. Follow these instructions at home: Incision care   Follow instructions from your health care provider about how to take care  of your incisions. Make sure you: ? Wash your hands with soap and water before and after you change your bandage (dressing). If soap and water are not available, use hand sanitizer. ? Change or remove your dressing as told by your health care provider. ? Leave any stitches (sutures), skin glue, or adhesive strips in place. These skin closures may need to stay in place for 2 weeks or longer. If adhesive strip edges start to loosen and curl up, you may trim the loose edges. Do not remove adhesive strips completely unless your health care provider tells you to do that.  Keep your dressing clean and dry.  Check your incision area every day for signs of infection. Check for: ? Redness, swelling, or pain that gets worse. ? Fluid or blood. ? Warmth. ? Pus or a bad smell. Activity  Rest as told by your health care provider.  Avoid sitting for a long time without moving. Get up to take short walks every 1-2 hours. This is important to improve blood flow and breathing. Ask for help if you feel weak or unsteady.  Return to your normal activities as told by your health care provider. Ask your health care provider what activities are safe for you.  Do noLAFREDA CASEBEERyour health care provider says that it is  safe.  Do not lift anything that is heavier than 10 lb (4.5 kg), or the limit that you are told, until your health care provider says that it is safe. This may be 2-6 weeks depending on your surgery.  Until your health care provider approves: ? Do not douche. ? Do not use tampons. ? Do not have sex. Medicines  Take over-the-counter and prescription medicines only as told by your health care provider.  Ask your health care provider if the medicine prescribed to you: ? Requires you to avoid driving or using heavy machinery. ? Can cause constipation. You may need to take actions to prevent or treat constipation, such as:  Drink enough fluid to keep your urine pale yellow.  Take  over-the-counter or prescription medicines.  Eat foods that are high in fiber, such as beans, whole grains, and fresh fruits and vegetables.  Limit foods that are high in fat and processed sugars, such as fried or sweet foods. General instructions  Wear compression stockings as told by your health care provider. These stockings help to prevent blood clots and reduce swelling in your legs.  Do not use any products that contain nicotine or tobacco, such as cigarettes, e-cigarettes, and chewing tobacco. If you need help quitting, ask your health care provider.  Do not take baths, swim, or use a hot tub until your health care provider approves. You may take showers.  Keep all follow-up visits as told by your health care provider. This is important. Contact a health care provider if you have:  Pain when you urinate.  Redness, swelling, or pain around an incision.  Fluid or blood coming from an incision.  Pus or a bad smell coming from an incision.  An incision that feels warm to the touch.  A fever.  Abdominal pain that gets worse or does not get better with medicine.  An incision that starts to break open.  A rash.  Light-headedness.  Nausea and vomiting. Get help right away if you:  Have pain in your chest or leg.  Develop shortness of breath.  Faint.  Have increased or heavy vaginal bleeding, such as soaking a pad in an hour. Summary  After the procedure, it is common to feel tired, have some pain in your abdomen, and have some light vaginal bleeding for a few days.  Follow instructions from your health care provider about how to take care of your incisions.  Return to your normal activities as told by your health care provider. Ask your health care provider what activities are safe for you.  Do not douche, use tampons, or have sex until your health care provider approves.  Keep all follow-up visits as told by your health care provider. This information is not  intended to replace advice given to you by your health care provider. Make sure you discuss any questions you have with your health care provider. Document Revised: 10/13/2018 Document Reviewed: 10/13/2018 Elsevier Patient Education  2020 [employer]  General Anesthesia, Adult, Care After This sheet gives you information about how to care for yourself after your procedure. Your health care provider may also give you more specific instructions. If you have problems or questions, contact your health care provider. What can I expect after the procedure? After the procedure, the following side effects are common:  Pain or discomfort at the IV site.  Nausea.  Vomiting.  Sore throat.  Trouble concentrating.  Feeling cold or chills.  Weak or tired.  Sleepiness and fatigue.  Soreness and body aches. These side effects can affect parts of the body that were not involved in surgery. Follow these instructions at home:  For at least 24 hours after the procedure:  Have a responsible adult stay with you. It is important to have someone help care for you until you are awake and alert.  Rest as needed.  Do not: ? Participate in activities in which you could fall or become injured. ? Drive. ? Use heavy machinery. ? Drink alcohol. ? Take sleeping pills or medicines that cause drowsiness. ? Make important decisions or sign legal documents. ? Take care of children on your own. Eating and drinking  Follow any instructions from your health care provider about eating or drinking restrictions.  When you feel hungry, start by eating small amounts of foods that are soft and easy to digest (bland), such as toast. Gradually return to your regular diet.  Drink enough fluid to keep your urine pale yellow.  If you vomit, rehydrate by drinking water, juice, or clear broth. General instructions  If you have sleep apnea, surgery and certain medicines can increase your risk for breathing  problems. Follow instructions from your health care provider about wearing your sleep device: ? Anytime you are sleeping, including during daytime naps. ? While taking prescription pain medicines, sleeping medicines, or medicines that make you drowsy.  Return to your normal activities as told by your health care provider. Ask your health care provider what activities are safe for you.  Take over-the-counter and prescription medicines only as told by your health care provider.  If you smoke, do not smoke without supervision.  Keep all follow-up visits as told by your health care provider. This is important. Contact a health care provider if:  You have nausea or vomiting that does not get better with medicine.  You cannot eat or drink without vomiting.  You have pain that does not get better with medicine.  You are unable to pass urine.  You develop a skin rash.  You have a fever.  You have redness around your IV site that gets worse. Get help right away if:  You have difficulty breathing.  You have chest pain.  You have blood in your urine or stool, or you vomit blood. Summary  After the procedure, it is common to have a sore throat or nausea. It is also common to feel tired.  Have a responsible adult stay with you for the first 24 hours after general anesthesia. It is important to have someone help care for you until you are awake and alert.  When you feel hungry, start by eating small amounts of foods that are soft and easy to digest (bland), such as toast. Gradually return to your regular diet.  Drink enough fluid to keep your urine pale yellow.  Return to your normal activities as told by your health care provider. Ask your health care provider what activities are safe for you. This information is not intended to replace advice given to you by your health care provider. Make sure you discuss any questions you have with your health care provider. Document Revised:  10/25/2017 Document Reviewed: 06/07/2017 Elsevier Patient Education  2020 [employer]. How to Use Chlorhexidine for Bathing Chlorhexidine gluconate (CHG) is a germ-killing (antiseptic) solution that is used to clean the skin. It can get rid of the bacteria that normally live on the skin and can keep them away for about 24 hours. To clean your skin with CHG, you  may be given:  A CHG solution to use in the shower or as part of a sponge bath.  A prepackaged cloth that contains CHG. Cleaning your skin with CHG may help lower the risk for infection:  While you are staying in the intensive care unit of the hospital.  If you have a vascular access, such as a central line, to provide short-term or long-term access to your veins.  If you have a catheter to drain urine from your bladder.  If you are on a ventilator. A ventilator is a machine that helps you breathe by moving air in and out of your lungs.  After surgery. What are the risks? Risks of using CHG include:  A skin reaction.  Hearing loss, if CHG gets in your ears.  Eye injury, if CHG gets in your eyes and is not rinsed out.  The CHG product catching fire. Make sure that you avoid smoking and flames after applying CHG to your skin. Do not use CHG:  If you have a chlorhexidine allergy or have previously reacted to chlorhexidine.  On babies younger than 76 months of age. How to use CHG solution  Use CHG only as told by your health care provider, and follow the instructions on the label.  Use the full amount of CHG as directed. Usually, this is one bottle. During a shower Follow these steps when using CHG solution during a shower (unless your health care provider gives you different instructions): 1. Start the shower. 2. Use your normal soap and shampoo to wash your face and hair. 3. Turn off the shower or move out of the shower stream. 4. Pour the CHG onto a clean washcloth. Do not use any type of brush or rough-edged  sponge. 5. Starting at your neck, lather your body down to your toes. Make sure you follow these instructions: ? If you will be having surgery, pay special attention to the part of your body where you will be having surgery. Scrub this area for at least 1 minute. ? Do not use CHG on your head or face. If the solution gets into your ears or eyes, rinse them well with water. ? Avoid your genital area. ? Avoid any areas of skin that have broken skin, cuts, or scrapes. ? Scrub your back and under your arms. Make sure to wash skin folds. 6. Let the lather sit on your skin for 1-2 minutes or as long as told by your health care provider. 7. Thoroughly rinse your entire body in the shower. Make sure that all body creases and crevices are rinsed well. 8. Dry off with a clean towel. Do not put any substances on your body afterward--such as powder, lotion, or perfume--unless you are told to do so by your health care provider. Only use lotions that are recommended by the manufacturer. 9. Put on clean clothes or pajamas. 10. If it is the night before your surgery, sleep in clean sheets.  During a sponge bath Follow these steps when using CHG solution during a sponge bath (unless your health care provider gives you different instructions): 1. Use your normal soap and shampoo to wash your face and hair. 2. Pour the CHG onto a clean washcloth. 3. Starting at your neck, lather your body down to your toes. Make sure you follow these instructions: ? If you will be having surgery, pay special attention to the part of your body where you will be having surgery. Scrub this area for at least 1  minute. ? Do not use CHG on your head or face. If the solution gets into your ears or eyes, rinse them well with water. ? Avoid your genital area. ? Avoid any areas of skin that have broken skin, cuts, or scrapes. ? Scrub your back and under your arms. Make sure to wash skin folds. 4. Let the lather sit on your skin for 1-2  minutes or as long as told by your health care provider. 5. Using a different clean, wet washcloth, thoroughly rinse your entire body. Make sure that all body creases and crevices are rinsed well. 6. Dry off with a clean towel. Do not put any substances on your body afterward--such as powder, lotion, or perfume--unless you are told to do so by your health care provider. Only use lotions that are recommended by the manufacturer. 7. Put on clean clothes or pajamas. 8. If it is the night before your surgery, sleep in clean sheets. How to use CHG prepackaged cloths  Only use CHG cloths as told by your health care provider, and follow the instructions on the label.  Use the CHG cloth on clean, dry skin.  Do not use the CHG cloth on your head or face unless your health care provider tells you to.  When washing with the CHG cloth: ? Avoid your genital area. ? Avoid any areas of skin that have broken skin, cuts, or scrapes. Before surgery Follow these steps when using a CHG cloth to clean before surgery (unless your health care provider gives you different instructions): 1. Using the CHG cloth, vigorously scrub the part of your body where you will be having surgery. Scrub using a back-and-forth motion for 3 minutes. The area on your body should be completely wet with CHG when you are done scrubbing. 2. Do not rinse. Discard the cloth and let the area air-dry. Do not put any substances on the area afterward, such as powder, lotion, or perfume. 3. Put on clean clothes or pajamas. 4. If it is the night before your surgery, sleep in clean sheets.  For general bathing Follow these steps when using CHG cloths for general bathing (unless your health care provider gives you different instructions). 1. Use a separate CHG cloth for each area of your body. Make sure you wash between any folds of skin and between your fingers and toes. Wash your body in the following order, switching to a new cloth after each  step: ? The front of your neck, shoulders, and chest. ? Both of your arms, under your arms, and your hands. ? Your stomach and groin area, avoiding the genitals. ? Your right leg and foot. ? Your left leg and foot. ? The back of your neck, your back, and your buttocks. 2. Do not rinse. Discard the cloth and let the area air-dry. Do not put any substances on your body afterward--such as powder, lotion, or perfume--unless you are told to do so by your health care provider. Only use lotions that are recommended by the manufacturer. 3. Put on clean clothes or pajamas. Contact a health care provider if:  Your skin gets irritated after scrubbing.  You have questions about using your solution or cloth. Get help right away if:  Your eyes become very red or swollen.  Your eyes itch badly.  Your skin itches badly and is red or swollen.  Your hearing changes.  You have trouble seeing.  You have swelling or tingling in your mouth or throat.  You have trouble  breathing.  You swallow any chlorhexidine. Summary  Chlorhexidine gluconate (CHG) is a germ-killing (antiseptic) solution that is used to clean the skin. Cleaning your skin with CHG may help to lower your risk for infection.  You may be given CHG to use for bathing. It may be in a bottle or in a prepackaged cloth to use on your skin. Carefully follow your health care provider's instructions and the instructions on the product label.  Do not use CHG if you have a chlorhexidine allergy.  Contact your health care provider if your skin gets irritated after scrubbing. This information is not intended to replace advice given to you by your health care provider. Make sure you discuss any questions you have with your health care provider. Document Revised: 01/08/2019 Document Reviewed: 09/19/2017 Elsevier Patient Education  [place].

## 2020-05-30 ENCOUNTER — Telehealth: Payer: Self-pay | Admitting: Obstetrics & Gynecology

## 2020-05-30 ENCOUNTER — Other Ambulatory Visit (HOSPITAL_COMMUNITY)
Admission: RE | Admit: 2020-05-30 | Discharge: 2020-05-30 | Disposition: A | Discharge status: Home / Self Care | Payer: Medicaid Other | Source: Ambulatory Visit | Attending: Obstetrics & Gynecology | Admitting: Obstetrics & Gynecology

## 2020-05-30 ENCOUNTER — Other Ambulatory Visit: Payer: Self-pay

## 2020-05-30 ENCOUNTER — Other Ambulatory Visit: Payer: Self-pay | Admitting: Obstetrics & Gynecology

## 2020-05-30 ENCOUNTER — Encounter (HOSPITAL_COMMUNITY)
Admission: RE | Admit: 2020-05-30 | Discharge: 2020-05-30 | Disposition: A | Discharge status: Home / Self Care | Payer: Medicaid Other | Source: Ambulatory Visit | Attending: Obstetrics & Gynecology | Admitting: Obstetrics & Gynecology

## 2020-05-30 DIAGNOSIS — Z20822 Contact with and (suspected) exposure to covid-19: Secondary | ICD-10-CM | POA: Diagnosis not present

## 2020-05-30 DIAGNOSIS — Z01812 Encounter for preprocedural laboratory examination: Secondary | ICD-10-CM | POA: Insufficient documentation

## 2020-05-30 LAB — URINALYSIS, ROUTINE W REFLEX MICROSCOPIC
Bilirubin Urine: NEGATIVE
Glucose, UA: NEGATIVE mg/dL
Hgb urine dipstick: NEGATIVE
Ketones, ur: NEGATIVE mg/dL
Nitrite: NEGATIVE
Protein, ur: NEGATIVE mg/dL
Specific Gravity, Urine: 1.029 (ref 1.005–1.030)
pH: 5 (ref 5.0–8.0)

## 2020-05-30 LAB — COMPREHENSIVE METABOLIC PANEL
ALT: 13 U/L (ref 0–44)
AST: 13 U/L — ABNORMAL LOW (ref 15–41)
Albumin: 4.2 g/dL (ref 3.5–5.0)
Alkaline Phosphatase: 63 U/L (ref 38–126)
Anion gap: 10 (ref 5–15)
BUN: 11 mg/dL (ref 6–20)
CO2: 27 mmol/L (ref 22–32)
Calcium: 9.4 mg/dL (ref 8.9–10.3)
Chloride: 102 mmol/L (ref 98–111)
Creatinine, Ser: 0.72 mg/dL (ref 0.44–1.00)
GFR calc Af Amer: >60 mL/min (ref >60)
GFR calc non Af Amer: >60 mL/min (ref >60)
Glucose, Bld: 102 mg/dL — ABNORMAL HIGH (ref 70–99)
Potassium: 3.6 mmol/L (ref 3.5–5.1)
Sodium: 139 mmol/L (ref 135–145)
Total Bilirubin: 0.3 mg/dL (ref 0.3–1.2)
Total Protein: 7.1 g/dL (ref 6.5–8.1)

## 2020-05-30 LAB — RAPID HIV SCREEN (HIV 1/2 AB+AG)
HIV 1/2 Antibodies: NONREACTIVE
HIV-1 P24 Antigen - HIV24: NONREACTIVE

## 2020-05-30 LAB — CBC
HCT: 42.1 % (ref 36.0–46.0)
Hemoglobin: 13.8 g/dL (ref 12.0–15.0)
MCH: 29.8 pg (ref 26.0–34.0)
MCHC: 32.8 g/dL (ref 30.0–36.0)
MCV: 90.9 fL (ref 80.0–100.0)
Platelets: 288 10*3/uL (ref 150–400)
RBC: 4.63 MIL/uL (ref 3.87–5.11)
RDW: 12.4 % (ref 11.5–15.5)
WBC: 6.9 10*3/uL (ref 4.0–10.5)
nRBC: 0 % (ref 0.0–0.2)

## 2020-05-30 LAB — SARS CORONAVIRUS 2 (TAT 6-24 HRS): SARS Coronavirus 2: NEGATIVE

## 2020-05-30 LAB — HCG, QUANTITATIVE, PREGNANCY: hCG, Beta Chain, Quant, S: <1 m[IU]/mL (ref <5)

## 2020-05-30 NOTE — Telephone Encounter (Signed)
Gotit, will remove IUD certainly and it was changed to electrocautery because that is the system we have at [facility]

## 2020-05-30 NOTE — Telephone Encounter (Signed)
Patient came in about concerns about her procedure, the hospital has something different then what she said she is suppose to be getting done and wants someone to call her about it.

## 2020-05-30 NOTE — Telephone Encounter (Signed)
Called patient back advised that her procedure is for btl with electrocautery. Patient states that is what she wants, earlier it said with clips. Patient also asked that I remind Dr. Despina Hidden to remove her IUD while he has her in the OR. Told patient I would route message to him. No other questions at this time.

## 2020-06-01 ENCOUNTER — Ambulatory Visit (HOSPITAL_COMMUNITY): Payer: Medicaid Other | Admitting: Anesthesiology

## 2020-06-01 ENCOUNTER — Encounter (HOSPITAL_COMMUNITY): Payer: Self-pay | Admitting: Obstetrics & Gynecology

## 2020-06-01 ENCOUNTER — Encounter (HOSPITAL_COMMUNITY)
Admission: RE | Disposition: A | Discharge status: Home / Self Care | Payer: Self-pay | Source: Home / Self Care | Attending: Obstetrics & Gynecology

## 2020-06-01 ENCOUNTER — Ambulatory Visit (HOSPITAL_COMMUNITY)
Admission: RE | Admit: 2020-06-01 | Discharge: 2020-06-01 | Disposition: A | Discharge status: Home / Self Care | Payer: Medicaid Other | Attending: Obstetrics & Gynecology | Admitting: Obstetrics & Gynecology

## 2020-06-01 DIAGNOSIS — Z302 Encounter for sterilization: Secondary | ICD-10-CM | POA: Diagnosis not present

## 2020-06-01 DIAGNOSIS — Z30432 Encounter for removal of intrauterine contraceptive device: Secondary | ICD-10-CM

## 2020-06-01 DIAGNOSIS — Z641 Problems related to multiparity: Secondary | ICD-10-CM | POA: Diagnosis not present

## 2020-06-01 HISTORY — PX: LAPAROSCOPIC TUBAL LIGATION: SHX1937

## 2020-06-01 HISTORY — PX: IUD REMOVAL: SHX5392

## 2020-06-01 SURGERY — LIGATION, FALLOPIAN TUBE, LAPAROSCOPIC
Anesthesia: General | Site: Vagina

## 2020-06-01 MED ORDER — PROMETHAZINE HCL 25 MG/ML IJ SOLN: 6.2500 mg | INTRAMUSCULAR | Status: DC | PRN

## 2020-06-01 MED ORDER — ONDANSETRON 8 MG PO TBDP
8.0000 mg | ORAL_TABLET | Freq: Three times a day (TID) | ORAL | 0 refills | Status: DC | PRN
Start: 2020-06-01 — End: 2020-06-13

## 2020-06-01 MED ORDER — MIDAZOLAM HCL 2 MG/2ML IJ SOLN
INTRAMUSCULAR | Status: AC
  Filled 2020-06-01: qty 2

## 2020-06-01 MED ORDER — ROCURONIUM BROMIDE 10 MG/ML (PF) SYRINGE
PREFILLED_SYRINGE | INTRAVENOUS | Status: AC
  Filled 2020-06-01: qty 10

## 2020-06-01 MED ORDER — CEFAZOLIN SODIUM-DEXTROSE 2-4 GM/100ML-% IV SOLN
2.0000 g | INTRAVENOUS | Status: AC
  Administered 2020-06-01: 2 g via INTRAVENOUS

## 2020-06-01 MED ORDER — BUPIVACAINE LIPOSOME 1.3 % IJ SUSP
INTRAMUSCULAR | Status: AC
  Filled 2020-06-01: qty 20

## 2020-06-01 MED ORDER — PROPOFOL 10 MG/ML IV BOLUS
INTRAVENOUS | Status: DC | PRN
  Administered 2020-06-01: 150 mg via INTRAVENOUS

## 2020-06-01 MED ORDER — CHLORHEXIDINE GLUCONATE 0.12 % MT SOLN
OROMUCOSAL | Status: AC
  Filled 2020-06-01: qty 15

## 2020-06-01 MED ORDER — FENTANYL CITRATE (PF) 100 MCG/2ML IJ SOLN
INTRAMUSCULAR | Status: AC
  Filled 2020-06-01: qty 2

## 2020-06-01 MED ORDER — BUPIVACAINE LIPOSOME 1.3 % IJ SUSP
20.0000 mL | Freq: Once | INTRAMUSCULAR | Status: DC
  Filled 2020-06-01: qty 20

## 2020-06-01 MED ORDER — ONDANSETRON HCL 4 MG/2ML IJ SOLN
INTRAMUSCULAR | Status: DC | PRN
  Administered 2020-06-01: 4 mg via INTRAVENOUS

## 2020-06-01 MED ORDER — POVIDONE-IODINE 10 % EX SWAB: 2.0000 "application " | Freq: Once | CUTANEOUS | Status: DC

## 2020-06-01 MED ORDER — SUCCINYLCHOLINE CHLORIDE 200 MG/10ML IV SOSY
PREFILLED_SYRINGE | INTRAVENOUS | Status: AC
  Filled 2020-06-01: qty 10

## 2020-06-01 MED ORDER — ORAL CARE MOUTH RINSE: 15.0000 mL | Freq: Once | OROMUCOSAL | Status: AC

## 2020-06-01 MED ORDER — DEXAMETHASONE SODIUM PHOSPHATE 10 MG/ML IJ SOLN
INTRAMUSCULAR | Status: DC | PRN
Start: 2020-06-01 — End: 2020-06-01
  Administered 2020-06-01: 5 mg via INTRAVENOUS

## 2020-06-01 MED ORDER — KETOROLAC TROMETHAMINE 30 MG/ML IJ SOLN
INTRAMUSCULAR | Status: AC
  Filled 2020-06-01: qty 1

## 2020-06-01 MED ORDER — CHLORHEXIDINE GLUCONATE 0.12 % MT SOLN
15.0000 mL | Freq: Once | OROMUCOSAL | Status: AC
  Administered 2020-06-01: 15 mL via OROMUCOSAL

## 2020-06-01 MED ORDER — LIDOCAINE 2% (20 MG/ML) 5 ML SYRINGE
INTRAMUSCULAR | Status: AC
  Filled 2020-06-01: qty 5

## 2020-06-01 MED ORDER — CEFAZOLIN SODIUM-DEXTROSE 2-4 GM/100ML-% IV SOLN
INTRAVENOUS | Status: AC
  Filled 2020-06-01: qty 100

## 2020-06-01 MED ORDER — MEPERIDINE HCL 50 MG/ML IJ SOLN: 6.2500 mg | INTRAMUSCULAR | Status: DC | PRN

## 2020-06-01 MED ORDER — LIDOCAINE 2% (20 MG/ML) 5 ML SYRINGE
INTRAMUSCULAR | Status: DC | PRN
  Administered 2020-06-01: 60 mg via INTRAVENOUS

## 2020-06-01 MED ORDER — LACTATED RINGERS IV SOLN
INTRAVENOUS | Status: DC | PRN
Start: 2020-06-01 — End: 2020-06-01

## 2020-06-01 MED ORDER — PROPOFOL 10 MG/ML IV BOLUS
INTRAVENOUS | Status: AC
  Filled 2020-06-01: qty 20

## 2020-06-01 MED ORDER — KETOROLAC TROMETHAMINE 30 MG/ML IJ SOLN
30.0000 mg | Freq: Once | INTRAMUSCULAR | Status: AC
  Administered 2020-06-01: 30 mg via INTRAVENOUS

## 2020-06-01 MED ORDER — HYDROCODONE-ACETAMINOPHEN 5-325 MG PO TABS: 1.0000 | ORAL_TABLET | Freq: Four times a day (QID) | ORAL | 0 refills | Status: DC | PRN

## 2020-06-01 MED ORDER — SODIUM CHLORIDE (PF) 0.9 % IJ SOLN
INTRAMUSCULAR | Status: AC
  Filled 2020-06-01: qty 20

## 2020-06-01 MED ORDER — LACTATED RINGERS IV SOLN: Freq: Once | INTRAVENOUS | Status: AC

## 2020-06-01 MED ORDER — ROCURONIUM BROMIDE 10 MG/ML (PF) SYRINGE
PREFILLED_SYRINGE | INTRAVENOUS | Status: DC | PRN
  Administered 2020-06-01: 60 mg via INTRAVENOUS

## 2020-06-01 MED ORDER — SUGAMMADEX SODIUM 200 MG/2ML IV SOLN
INTRAVENOUS | Status: DC | PRN
  Administered 2020-06-01: 200 mg via INTRAVENOUS

## 2020-06-01 MED ORDER — FENTANYL CITRATE (PF) 250 MCG/5ML IJ SOLN
INTRAMUSCULAR | Status: DC | PRN
  Administered 2020-06-01 (×2): 50 ug via INTRAVENOUS

## 2020-06-01 MED ORDER — DEXAMETHASONE SODIUM PHOSPHATE 10 MG/ML IJ SOLN
INTRAMUSCULAR | Status: AC
  Filled 2020-06-01: qty 1

## 2020-06-01 MED ORDER — BUPIVACAINE LIPOSOME 1.3 % IJ SUSP
INTRAMUSCULAR | Status: DC | PRN
  Administered 2020-06-01: 20 mL

## 2020-06-01 MED ORDER — HYDROMORPHONE HCL 1 MG/ML IJ SOLN: 0.2500 mg | INTRAMUSCULAR | Status: DC | PRN

## 2020-06-01 MED ORDER — MIDAZOLAM HCL 5 MG/5ML IJ SOLN
INTRAMUSCULAR | Status: DC | PRN
  Administered 2020-06-01: 2 mg via INTRAVENOUS

## 2020-06-01 MED ORDER — KETOROLAC TROMETHAMINE 10 MG PO TABS
10.0000 mg | ORAL_TABLET | Freq: Three times a day (TID) | ORAL | 0 refills | Status: DC | PRN
Start: 2020-06-01 — End: 2020-06-13

## 2020-06-01 SURGICAL SUPPLY — 35 items
ADH SKN CLS APL DERMABOND .7 (GAUZE/BANDAGES/DRESSINGS) ×2
BAG HAMPER (MISCELLANEOUS) ×4 IMPLANT
BLADE SURG SZ11 CARB STEEL (BLADE) ×4 IMPLANT
CLOTH BEACON ORANGE TIMEOUT ST (SAFETY) ×4 IMPLANT
COVER LIGHT HANDLE STERIS (MISCELLANEOUS) ×8 IMPLANT
COVER WAND RF STERILE (DRAPES) ×8 IMPLANT
DERMABOND ADVANCED (GAUZE/BANDAGES/DRESSINGS) ×2
DERMABOND ADVANCED .7 DNX12 (GAUZE/BANDAGES/DRESSINGS) ×2 IMPLANT
ELECT REM PT RETURN 9FT ADLT (ELECTROSURGICAL) ×4
ELECTRODE REM PT RTRN 9FT ADLT (ELECTROSURGICAL) ×2 IMPLANT
GAUZE 4X4 16PLY RFD (DISPOSABLE) ×4 IMPLANT
GLOVE BIOGEL PI IND STRL 7.0 (GLOVE) ×4 IMPLANT
GLOVE BIOGEL PI IND STRL 8 (GLOVE) ×2 IMPLANT
GLOVE BIOGEL PI INDICATOR 7.0 (GLOVE) ×4
GLOVE BIOGEL PI INDICATOR 8 (GLOVE) ×2
GLOVE ECLIPSE 8.0 STRL XLNG CF (GLOVE) ×4 IMPLANT
GOWN STRL REUS W/TWL LRG LVL3 (GOWN DISPOSABLE) ×4 IMPLANT
GOWN STRL REUS W/TWL XL LVL3 (GOWN DISPOSABLE) ×4 IMPLANT
KIT TURNOVER CYSTO (KITS) ×4 IMPLANT
MANIFOLD NEPTUNE II (INSTRUMENTS) ×4 IMPLANT
NDL INSUFFLATION 14GA 120MM (NEEDLE) ×2 IMPLANT
NEEDLE HYPO 22GX1.5 SAFETY (NEEDLE) ×4 IMPLANT
NEEDLE INSUFFLATION 14GA 120MM (NEEDLE) ×4 IMPLANT
PACK PERI GYN (CUSTOM PROCEDURE TRAY) ×4 IMPLANT
PAD ARMBOARD 7.5X6 YLW CONV (MISCELLANEOUS) ×4 IMPLANT
SET BASIN LINEN APH (SET/KITS/TRAYS/PACK) ×4 IMPLANT
SOL ANTI FOG 6CC (MISCELLANEOUS) ×2 IMPLANT
SOLUTION ANTI FOG 6CC (MISCELLANEOUS) ×2
SUT VICRYL 0 UR6 27IN ABS (SUTURE) ×4 IMPLANT
SUT VICRYL AB 3-0 FS1 BRD 27IN (SUTURE) ×4 IMPLANT
SYR 10ML LL (SYRINGE) ×4 IMPLANT
TROCAR ENDO BLADELESS 11MM (ENDOMECHANICALS) ×4 IMPLANT
TROCAR XCEL NON-BLD 5MMX100MML (ENDOMECHANICALS) ×4 IMPLANT
TUBING EVAC SMOKE HEATED PNEUM (TUBING) ×4 IMPLANT
WARMER LAPAROSCOPE (MISCELLANEOUS) ×4 IMPLANT

## 2020-06-01 NOTE — Discharge Instructions (Signed)
Laparoscopic Tubal Ligation, Care After This sheet gives you information about how to care for yourself after your procedure. Your health care provider may also give you more specific instructions. If you have problems or questions, contact your health care provider. What can I expect after the procedure? After the procedure, it is common to have:  A sore throat.  Discomfort in your shoulder.  Mild discomfort or cramping in your abdomen.  Gas pains.  Pain or soreness in the area where the surgical incision was made.  A bloated feeling.  Tiredness.  Nausea.  Vomiting. Follow these instructions at home: Medicines  Take over-the-counter and prescription medicines only as told by your health care provider.  Do not take aspirin because it can cause bleeding.  Ask your health care provider if the medicine prescribed to you: ? Requires you to avoid driving or using heavy machinery. ? Can cause constipation. You may need to take actions to prevent or treat constipation, such as:  Drink enough fluid to keep your urine pale yellow.  Take over-the-counter or prescription medicines.  Eat foods that are high in fiber, such as beans, whole grains, and fresh fruits and vegetables.  Limit foods that are high in fat and processed sugars, such as fried or sweet foods. Incision care      Follow instructions from your health care provider about how to take care of your incision. Make sure you: ? Wash your hands with soap and water before and after you change your bandage (dressing). If soap and water are not available, use hand sanitizer. ? Change your dressing as told by your health care provider. ? Leave stitches (sutures), skin glue, or adhesive strips in place. These skin closures may need to stay in place for 2 weeks or longer. If adhesive strip edges start to loosen and curl up, you may trim the loose edges. Do not remove adhesive strips completely unless your health care provider  tells you to do that.  Check your incision area every day for signs of infection. Check for: ? Redness, swelling, or pain. ? Fluid or blood. ? Warmth. ? Pus or a bad smell. Activity  Rest as told by your health care provider.  Avoid sitting for a long time without moving. Get up to take short walks every 1-2 hours. This is important to improve blood flow and breathing. Ask for help if you feel weak or unsteady.  Return to your normal activities as told by your health care provider. Ask your health care provider what activities are safe for you. General instructions  Do not take baths, swim, or use a hot tub until your health care provider approves. Ask your health care provider if you may take showers. You may only be allowed to take sponge baths.  Have someone help you with your daily household tasks for the first few days.  Keep all follow-up visits as told by your health care provider. This is important. Contact a health care provider if:  You have redness, swelling, or pain around your incision.  Your incision feels warm to the touch.  You have pus or a bad smell coming from your incision.  The edges of your incision break open after the sutures have been removed.  Your pain does not improve after 2-3 days.  You have a rash.  You repeatedly become dizzy or light-headed.  Your pain medicine is not helping. Get help right away if you:  Have a fever.  Faint.  Have increasing   pain in your abdomen.  Have severe pain in one or both of your shoulders.  Have fluid or blood coming from your sutures or from your vagina.  Have shortness of breath or difficulty breathing.  Have chest pain or leg pain.  Have ongoing nausea, vomiting, or diarrhea. Summary  After the procedure, it is common to have mild discomfort or cramping in your abdomen.  Take over-the-counter and prescription medicines only as told by your health care provider.  Watch for symptoms that should  prompt you to call your health care provider.  Keep all follow-up visits as told by your health care provider. This is important. This information is not intended to replace advice given to you by your health care provider. Make sure you discuss any questions you have with your health care provider. Document Revised: 03/31/2019 Document Reviewed: 09/16/2018 Elsevier Patient Education  2020 [employer]   General Anesthesia, Adult, Care After This sheet gives you information about how to care for yourself after your procedure. Your health care provider may also give you more specific instructions. If you have problems or questions, contact your health care provider. What can I expect after the procedure? After the procedure, the following side effects are common:  Pain or discomfort at the IV site.  Nausea.  Vomiting.  Sore throat.  Trouble concentrating.  Feeling cold or chills.  Weak or tired.  Sleepiness and fatigue.  Soreness and body aches. These side effects can affect parts of the body that were not involved in surgery. Follow these instructions at home:  For at least 24 hours after the procedure:  Have a responsible adult stay with you. It is important to have someone help care for you until you are awake and alert.  Rest as needed.  Do not: ? Participate in activities in which you could fall or become injured. ? Drive. ? Use heavy machinery. ? Drink alcohol. ? Take sleeping pills or medicines that cause drowsiness. ? Make important decisions or sign legal documents. ? Take care of children on your own. Eating and drinking  Follow any instructions from your health care provider about eating or drinking restrictions.  When you feel hungry, start by eating small amounts of foods that are soft and easy to digest (bland), such as toast. Gradually return to your regular diet.  Drink enough fluid to keep your urine pale yellow.  If you vomit, rehydrate by  drinking water, juice, or clear broth. General instructions  If you have sleep apnea, surgery and certain medicines can increase your risk for breathing problems. Follow instructions from your health care provider about wearing your sleep device: ? Anytime you are sleeping, including during daytime naps. ? While taking prescription pain medicines, sleeping medicines, or medicines that make you drowsy.  Return to your normal activities as told by your health care provider. Ask your health care provider what activities are safe for you.  Take over-the-counter and prescription medicines only as told by your health care provider.  If you smoke, do not smoke without supervision.  Keep all follow-up visits as told by your health care provider. This is important. Contact a health care provider if:  You have nausea or vomiting that does not get better with medicine.  You cannot eat or drink without vomiting.  You have pain that does not get better with medicine.  You are unable to pass urine.  You develop a skin rash.  You have a fever.  You have redness around  your IV site that gets worse. Get help right away if:  You have difficulty breathing.  You have chest pain.  You have blood in your urine or stool, or you vomit blood. Summary  After the procedure, it is common to have a sore throat or nausea. It is also common to feel tired.  Have a responsible adult stay with you for the first 24 hours after general anesthesia. It is important to have someone help care for you until you are awake and alert.  When you feel hungry, start by eating small amounts of foods that are soft and easy to digest (bland), such as toast. Gradually return to your regular diet.  Drink enough fluid to keep your urine pale yellow.  Return to your normal activities as told by your health care provider. Ask your health care provider what activities are safe for you. This information is not intended to  replace advice given to you by your health care provider. Make sure you discuss any questions you have with your health care provider. Document Revised: 10/25/2017 Document Reviewed: 06/07/2017 Elsevier Patient Education  2020 [employer].

## 2020-06-01 NOTE — H&P (Signed)
Preoperative History and Physical  DEDRA Maynard is a 24 y.o. [phone] with Patient's last menstrual period was 05/23/2020. admitted for a Laparoscopic BTL using electrocautery and IUD removal.  Pt is aware of the permanent nature of the procedure and the expense and surgery required for reanastomosis.  She understands that her age places her in a high risk group for tubal regret but she is insistent on proceeding at this point  PMH:    Past Medical History:  Diagnosis Date  . Medical history non-contributory     PSH:     Past Surgical History:  Procedure Laterality Date  . TONSILLECTOMY AND ADENOIDECTOMY Bilateral 2003   Performed at [facility]    POb/GynH:      OB History    Gravida  2   Para  2   Term  2   Preterm      AB      Living  2     SAB      TAB      Ectopic      Multiple  0   Live Births  2           SH:   Social History   Tobacco Use  . Smoking status: Never Smoker  . Smokeless tobacco: Never Used  Vaping Use  . Vaping Use: Never used  Substance Use Topics  . Alcohol use: No  . Drug use: No    FH:    Family History  Problem Relation Age of Onset  . Depression Father   . Stroke Father   . Hypertension Father   . Heart disease Father   . Heart attack Maternal Grandfather   . Congestive Heart Failure Paternal Grandmother   . Migraines Paternal Grandmother   . COPD Paternal Grandmother   . Lung cancer Paternal Grandfather   . Autism Cousin        2 paternal first cousins have autism  . ADD / ADHD Cousin        Several paternal first cousins have ADHD/ADD     Allergies:  Allergies  Allergen Reactions  . Other Hives and Rash    Glitter make-up causes rash and hives     Medications:       Current Facility-Administered Medications:  .  bupivacaine liposome (EXPAREL) 1.3 % injection 266 mg, 20 mL, Infiltration, Once, Zeniya Lapidus H, MD .  ceFAZolin (ANCEF) IVPB 2g/100 mL premix, 2 g, Intravenous, On  Call to OR, Lazaro Arms, MD .  povidone-iodine 10 % swab 2 application, 2 application, Topical, Once, Lazaro Arms, MD  Review of Systems:   Review of Systems  Constitutional: Negative for fever, chills, weight loss, malaise/fatigue and diaphoresis.  HENT: Negative for hearing loss, ear pain, nosebleeds, congestion, sore throat, neck pain, tinnitus and ear discharge.   Eyes: Negative for blurred vision, double vision, photophobia, pain, discharge and redness.  Respiratory: Negative for cough, hemoptysis, sputum production, shortness of breath, wheezing and stridor.   Cardiovascular: Negative for chest pain, palpitations, orthopnea, claudication, leg swelling and PND.  Gastrointestinal: Positive for abdominal pain. Negative for heartburn, nausea, vomiting, diarrhea, constipation, blood in stool and melena.  Genitourinary: Negative for dysuria, urgency, frequency, hematuria and flank pain.  Musculoskeletal: Negative for myalgias, back pain, joint pain and falls.  Skin: Negative for itching and rash.  Neurological: Negative for dizziness, tingling, tremors, sensory change, speech change, focal weakness, seizures, loss of consciousness, weakness and headaches.  Endo/Heme/Allergies: Negative for  environmental allergies and polydipsia. Does not bruise/bleed easily.  Psychiatric/Behavioral: Negative for depression, suicidal ideas, hallucinations, memory loss and substance abuse. The patient is not nervous/anxious and does not have insomnia.      PHYSICAL EXAM:  Blood pressure 112/74, pulse 92, temperature 98.5 F (36.9 C), temperature source Oral, resp. rate 16, height 5\' 7"  (1.702 m), weight 72.6 kg, last menstrual period 05/23/2020, SpO2 98 %, not currently breastfeeding.    Vitals reviewed. Constitutional: She is oriented to person, place, and time. She appears well-developed and well-nourished.  HENT:  Head: Normocephalic and atraumatic.  Right Ear: External ear normal.  Left Ear:  External ear normal.  Nose: Nose normal.  Mouth/Throat: Oropharynx is clear and moist.  Eyes: Conjunctivae and EOM are normal. Pupils are equal, round, and reactive to light. Right eye exhibits no discharge. Left eye exhibits no discharge. No scleral icterus.  Neck: Normal range of motion. Neck supple. No tracheal deviation present. No thyromegaly present.  Cardiovascular: Normal rate, regular rhythm, normal heart sounds and intact distal pulses.  Exam reveals no gallop and no friction rub.   No murmur heard. Respiratory: Effort normal and breath sounds normal. No respiratory distress. She has no wheezes. She has no rales. She exhibits no tenderness.  GI: Soft. Bowel sounds are normal. She exhibits no distension and no mass. There is tenderness. There is no rebound and no guarding.  Genitourinary:       Vulva is normal without lesions Vagina is pink moist without discharge Cervix normal in appearance and pap is normal Uterus is normal size, contour, position, consistency, mobility, non-tender Adnexa is negative with normal sized ovaries by sonogram  Musculoskeletal: Normal range of motion. She exhibits no edema and no tenderness.  Neurological: She is alert and oriented to person, place, and time. She has normal reflexes. She displays normal reflexes. No cranial nerve deficit. She exhibits normal muscle tone. Coordination normal.  Skin: Skin is warm and dry. No rash noted. No erythema. No pallor.  Psychiatric: She has a normal mood and affect. Her behavior is normal. Judgment and thought content normal.    Labs: Results for orders placed or performed during the hospital encounter of 05/30/20 (from the past 336 hour(s))  CBC   Collection Time: 05/30/20  7:20 AM  Result Value Ref Range   WBC 6.9 4.0 - 10.5 K/uL   RBC 4.63 3.87 - 5.11 MIL/uL   Hemoglobin 13.8 12.0 - 15.0 g/dL   HCT 06/01/20 36 - 46 %   MCV 90.9 80.0 - 100.0 fL   MCH 29.8 26.0 - 34.0 pg   MCHC 32.8 30.0 - 36.0 g/dL   RDW  02.6 37.8 - 58.8 %   Platelets 288 150 - 400 K/uL   nRBC 0.0 0.0 - 0.2 %  Comprehensive metabolic panel   Collection Time: 05/30/20  7:20 AM  Result Value Ref Range   Sodium 139 135 - 145 mmol/L   Potassium 3.6 3.5 - 5.1 mmol/L   Chloride 102 98 - 111 mmol/L   CO2 27 22 - 32 mmol/L   Glucose, Bld 102 (H) 70 - 99 mg/dL   BUN 11 6 - 20 mg/dL   Creatinine, Ser 06/01/20 0.44 - 1.00 mg/dL   Calcium 9.4 8.9 - 7.74 mg/dL   Total Protein 7.1 6.5 - 8.1 g/dL   Albumin 4.2 3.5 - 5.0 g/dL   AST 13 (L) 15 - 41 U/L   ALT 13 0 - 44 U/L   Alkaline Phosphatase 63  38 - 126 U/L   Total Bilirubin 0.3 0.3 - 1.2 mg/dL   GFR calc non Af Amer >60 >60 mL/min   GFR calc Af Amer >60 >60 mL/min   Anion gap 10 5 - 15  hCG, quantitative, pregnancy   Collection Time: 05/30/20  7:20 AM  Result Value Ref Range   hCG, Beta Chain, Quant, S <1 <5 mIU/mL  Rapid HIV screen (HIV 1/2 Ab+Ag)   Collection Time: 05/30/20  7:20 AM  Result Value Ref Range   HIV-1 P24 Antigen - HIV24 NON REACTIVE NON REACTIVE   HIV 1/2 Antibodies NON REACTIVE NON REACTIVE   Interpretation (HIV Ag Ab)      A non reactive test result means that HIV 1 or HIV 2 antibodies and HIV 1 p24 antigen were not detected in the specimen.  SARS CORONAVIRUS 2 (TAT 6-24 HRS) Nasopharyngeal Nasopharyngeal Swab   Collection Time: 05/30/20  7:44 AM   Specimen: Nasopharyngeal Swab  Result Value Ref Range   SARS Coronavirus 2 NEGATIVE NEGATIVE  Urinalysis, Routine w reflex microscopic   Collection Time: 05/30/20  7:44 AM  Result Value Ref Range   Color, Urine YELLOW YELLOW   APPearance CLOUDY (A) CLEAR   Specific Gravity, Urine 1.029 1.005 - 1.030   pH 5.0 5.0 - 8.0   Glucose, UA NEGATIVE NEGATIVE mg/dL   Hgb urine dipstick NEGATIVE NEGATIVE   Bilirubin Urine NEGATIVE NEGATIVE   Ketones, ur NEGATIVE NEGATIVE mg/dL   Protein, ur NEGATIVE NEGATIVE mg/dL   Nitrite NEGATIVE NEGATIVE   Leukocytes,Ua TRACE (A) NEGATIVE   RBC / HPF 0-5 0 - 5 RBC/hpf   WBC,  UA 11-20 0 - 5 WBC/hpf   Bacteria, UA RARE (A) NONE SEEN   Squamous Epithelial / LPF 21-50 0 - 5   Mucus PRESENT   Results for orders placed or performed during the hospital encounter of 05/18/20 (from the past 336 hour(s))  POCT urine pregnancy   Collection Time: 05/18/20  2:27 PM  Result Value Ref Range   Preg Test, Ur Negative Negative    EKG: Orders placed or performed during the hospital encounter of 05/04/18  . ED EKG  . EKG 12-Lead  . EKG 12-Lead  . ED EKG  . EKG    Imaging Studies: DG Lumbar Spine Complete  Result Date: 05/18/2020 CLINICAL DATA:  Low back pain after fall today down stairs. EXAM: LUMBAR SPINE - COMPLETE 4+ VIEW COMPARISON:  None. FINDINGS: There is no evidence of lumbar spine fracture. Alignment is normal. Intervertebral disc spaces are maintained. IMPRESSION: Negative. Electronically Signed   By: Lupita Raider M.D.   On: 05/18/2020 14:45      Assessment:      Patient Active Problem List   Diagnosis Date Noted  . IUD (intrauterine device) in place 08/17/2019  . Postpartum depression 08/17/2019    Plan: Laparoscopic bilateral tubal ligation using electrocautery, removal of IUD  Lazaro Arms 06/01/2020 7:46 AM

## 2020-06-01 NOTE — Op Note (Signed)
Preoperative Diagnosis:  Multiparous female desires permanent sterilization  Postoperative Diagnosis:  Same as above  Procedure:  Laparoscopic Bilateral Tubal Ligation using electrocautery                     Removal of IUD  Surgeon:  Rockne Coons MD  Anaesthesia:  LMA  Findings:  Patient had normal pelvic anatomy and no intraperitoneal abnormalities.  Description of Operation:  Patient was taken to the OR and placed into supine position where she underwent LMA anaesthesia.  She was placed in the dorsal lithotomy position and prepped and draped in the usual sterile fashion.  An incision was made in the umbilicus and dissection taken down to the rectus fascia.  The rectus fascia was grasped with a coker clamp and a Veres needle was placed into the peritoneal cavity with 1 pass without difficulty.  The perioneal cavity was insufflated.    The non bladed trocar was then placed.  The above noted findings were observed. A 5 mm incision was made in the RLQ and a 5 mm non bladed trocar was placed under direct visualization without difficulty. The Bipolar cautery electrocautery unit was employed and both tubes were burned to no resistance and beyond in the distal ishtmic and ampullary regions, approximately a 3.5 cm segment bilaterally.  No resistance was confirmed with the software graphing of current flow/resistance graphs.  There was good hemostasis.  The fascia, peritoneum and subcutaneous tissue were closed using 0 vicryl.  The skin was closed using 3-0 vicryl in a subcuticular fashion.  The IUD was removed without difficulty.  The patient was awakened from anaesthesia and taken to the PACU with all counts being correct x 3.  The patient received Ancef 2 gram and Toradol 30 mg IV preoperatively.   Blood loss was 20 cc  Lazaro Arms, MD  06/01/2020 9:58 AM

## 2020-06-01 NOTE — Anesthesia Postprocedure Evaluation (Signed)
Anesthesia Post Note  Patient: Jamie Maynard  Procedure(s) Performed: LAPAROSCOPIC BILATERAL TUBAL LIGATION USING ELECTROCAUTERY (Bilateral Abdomen) INTRAUTERINE DEVICE (IUD) REMOVAL (N/A Vagina )  Patient location during evaluation: PACU Anesthesia Type: General Level of consciousness: awake, awake and alert and oriented Pain management: pain level controlled Vital Signs Assessment: post-procedure vital signs reviewed and stable Respiratory status: spontaneous breathing Cardiovascular status: blood pressure returned to baseline and stable Postop Assessment: no apparent nausea or vomiting Anesthetic complications: no   No complications documented.   Last Vitals:  Vitals:   06/01/20 1030 06/01/20 1037  BP:  (!) 113/64  Pulse: 70 69  Resp: (!) 11 16  Temp:  36.7 C  SpO2: 99% 98%    Last Pain:  Vitals:   06/01/20 1037  TempSrc: Oral  PainSc: 2                  Janani Chamber C Vetta Couzens

## 2020-06-01 NOTE — Anesthesia Procedure Notes (Signed)
Procedure Name: Intubation Date/Time: 06/01/2020 8:33 AM Performed by: Myna Bright, CRNA Pre-anesthesia Checklist: Patient identified, Emergency Drugs available, Suction available and Patient being monitored Patient Re-evaluated:Patient Re-evaluated prior to induction Oxygen Delivery Method: Circle system utilized Preoxygenation: Pre-oxygenation with 100% oxygen Induction Type: IV induction Ventilation: Mask ventilation without difficulty Laryngoscope Size: Mac and 3 Grade View: Grade I Tube type: Oral Tube size: 7.0 mm Number of attempts: 1 Airway Equipment and Method: Stylet Placement Confirmation: ETT inserted through vocal cords under direct vision,  positive ETCO2 and breath sounds checked- equal and bilateral Secured at: 21 cm Tube secured with: Tape Dental Injury: Teeth and Oropharynx as per pre-operative assessment

## 2020-06-01 NOTE — Transfer of Care (Signed)
Immediate Anesthesia Transfer of Care Note  Patient: Jamie Maynard  Procedure(s) Performed: LAPAROSCOPIC BILATERAL TUBAL LIGATION USING ELECTROCAUTERY (Bilateral Abdomen) INTRAUTERINE DEVICE (IUD) REMOVAL (N/A Vagina )  Patient Location: PACU  Anesthesia Type:General  Level of Consciousness: awake, alert , oriented and patient cooperative  Airway & Oxygen Therapy: Patient Spontanous Breathing and Patient connected to nasal cannula oxygen  Post-op Assessment: Report given to RN, Post -op Vital signs reviewed and stable and Patient moving all extremities  Post vital signs: Reviewed and stable  Last Vitals:  Vitals Value Taken Time  BP 120/65 06/01/20 0939  Temp    Pulse 76 06/01/20 0944  Resp 8 06/01/20 0943  SpO2 100 % 06/01/20 0944  Vitals shown include unvalidated device data.  Last Pain:  Vitals:   06/01/20 0641  TempSrc: Oral  PainSc: 0-No pain      Patients Stated Pain Goal: 7 (06/01/20 0641)  Complications: No complications documented.

## 2020-06-01 NOTE — Anesthesia Preprocedure Evaluation (Signed)
Anesthesia Evaluation  Patient identified by MRN, date of birth, ID band Patient awake    Reviewed: Allergy & Precautions, NPO status , Patient's Chart, lab work & pertinent test results  History of Anesthesia Complications Negative for: history of anesthetic complications  Airway Mallampati: I  TM Distance: >3 FB Neck ROM: Full    Dental  (+) Dental Advisory Given   Pulmonary neg pulmonary ROS,    Pulmonary exam normal breath sounds clear to auscultation       Cardiovascular Normal cardiovascular exam Rhythm:Regular Rate:Normal     Neuro/Psych PSYCHIATRIC DISORDERS Depression    GI/Hepatic negative GI ROS, Neg liver ROS,   Endo/Other  negative endocrine ROS  Renal/GU negative Renal ROS     Musculoskeletal   Abdominal   Peds  Hematology negative hematology ROS (+)   Anesthesia Other Findings   Reproductive/Obstetrics                            Anesthesia Physical Anesthesia Plan  ASA: I  Anesthesia Plan: General   Post-op Pain Management:    Induction:   PONV Risk Score and Plan: 4 or greater and Ondansetron, Dexamethasone and Midazolam  Airway Management Planned: Oral ETT  Additional Equipment:   Intra-op Plan:   Post-operative Plan: Extubation in OR  Informed Consent: I have reviewed the patients History and Physical, chart, labs and discussed the procedure including the risks, benefits and alternatives for the proposed anesthesia with the patient or authorized representative who has indicated his/her understanding and acceptance.     Dental advisory given  Plan Discussed with: CRNA and Surgeon  Anesthesia Plan Comments:         Anesthesia Quick Evaluation

## 2020-06-02 ENCOUNTER — Encounter (HOSPITAL_COMMUNITY): Payer: Self-pay | Admitting: Obstetrics & Gynecology

## 2020-06-09 ENCOUNTER — Telehealth: Payer: Medicaid Other | Admitting: Obstetrics & Gynecology

## 2020-06-13 ENCOUNTER — Other Ambulatory Visit: Payer: Self-pay

## 2020-06-13 ENCOUNTER — Telehealth (INDEPENDENT_AMBULATORY_CARE_PROVIDER_SITE_OTHER): Payer: Medicaid Other | Admitting: Obstetrics & Gynecology

## 2020-06-13 ENCOUNTER — Telehealth: Payer: Self-pay | Admitting: Obstetrics & Gynecology

## 2020-06-13 ENCOUNTER — Encounter: Payer: Self-pay | Admitting: Obstetrics & Gynecology

## 2020-06-13 DIAGNOSIS — Z9889 Other specified postprocedural states: Secondary | ICD-10-CM

## 2020-06-13 NOTE — Progress Notes (Signed)
°  HPI: Patient returns for routine postoperative follow-up having undergone lap BTL and removed IUD on 7/28.  The patient's immediate postoperative recovery has been unremarkable. Since hospital discharge the patient reports no problems.   No current outpatient medications on file. No current facility-administered medications for this visit.    Last menstrual period 05/23/2020, not currently breastfeeding.  Physical Exam: N/a video visit  Diagnostic Tests:   Pathology:   Impression: Normal post op visit  Plan: Yearly or as needed  Follow up: prn    Lazaro Arms, MD

## 2020-06-13 NOTE — Telephone Encounter (Signed)
Patient called stating that she recently had surgery and her Job is needing a letter faxed over stating that she had surgery and she is able to return such date. Pt states to please fax over the letter to [phone] and make it ATTN: Larena Sox. Please contact pt when letter has been faxed.

## 2020-06-14 ENCOUNTER — Encounter: Payer: Self-pay | Admitting: Obstetrics & Gynecology

## 2020-06-14 ENCOUNTER — Telehealth: Payer: Self-pay | Admitting: Obstetrics & Gynecology

## 2020-06-14 NOTE — Telephone Encounter (Signed)
Patient called stating that she recently had surgery and her Job is needing a letter faxed over stating that she had surgery and she is able to return such date. Pt states to please fax over the letter to [phone] and make it ATTN: Monique Craighead. Please contact pt when letter has been faxed. 

## 2020-06-14 NOTE — Telephone Encounter (Signed)
Sorry lex it went to you by mistake

## 2020-06-14 NOTE — Telephone Encounter (Signed)
Now available on my chart

## 2020-06-14 NOTE — Telephone Encounter (Signed)
I don't knw what the return date is

## 2020-06-14 NOTE — Telephone Encounter (Signed)
Pt was calling back she missed a call looks like someone was calling to find out a return to work date for the note. She returned to work on Monday the 9th

## 2020-06-14 NOTE — Telephone Encounter (Signed)
Telephoned patient at home number and was unable to leave message due to voicemail box not set up.

## 2020-06-14 NOTE — Telephone Encounter (Signed)
Idon't know what the return date is, does pt now what date she wants

## 2020-06-15 ENCOUNTER — Ambulatory Visit
Admission: EM | Admit: 2020-06-15 | Discharge: 2020-06-15 | Disposition: A | Discharge status: Home / Self Care | Payer: Medicaid Other

## 2020-06-15 ENCOUNTER — Other Ambulatory Visit: Payer: Self-pay

## 2020-06-15 ENCOUNTER — Encounter: Payer: Self-pay | Admitting: Emergency Medicine

## 2020-06-15 NOTE — ED Notes (Signed)
Patient is being discharged from the Urgent Care and sent to the Emergency Department via pov . Per B. Wurst, patient is in need of higher level of care due to heavy vaginal bleeding. Patient is aware and verbalizes understanding of plan of care.  Vitals:   06/15/20 1525  BP: 115/75  Pulse: 90  Resp: 17  Temp: 98 F (36.7 C)  SpO2: 98%

## 2020-06-15 NOTE — ED Triage Notes (Signed)
Had IUD removed and tubal ligation x 2 weeks ago. Pt has had heavy bleeding for her past 2 periods, states she is bleeding through a super plus tampon and pad x 1 hour.

## 2020-06-15 NOTE — Telephone Encounter (Signed)
Pt states the new fax # [phone]

## 2020-06-16 ENCOUNTER — Ambulatory Visit (INDEPENDENT_AMBULATORY_CARE_PROVIDER_SITE_OTHER): Payer: Medicaid Other | Admitting: Adult Health

## 2020-06-16 ENCOUNTER — Encounter: Payer: Self-pay | Admitting: *Deleted

## 2020-06-16 ENCOUNTER — Telehealth: Payer: Self-pay | Admitting: Obstetrics & Gynecology

## 2020-06-16 ENCOUNTER — Encounter: Payer: Self-pay | Admitting: Adult Health

## 2020-06-16 VITALS — BP 131/79 | HR 88 | Ht 67.0 in | Wt 175.0 lb

## 2020-06-16 DIAGNOSIS — N921 Excessive and frequent menstruation with irregular cycle: Secondary | ICD-10-CM

## 2020-06-16 DIAGNOSIS — Z3202 Encounter for pregnancy test, result negative: Secondary | ICD-10-CM | POA: Diagnosis not present

## 2020-06-16 DIAGNOSIS — N920 Excessive and frequent menstruation with regular cycle: Secondary | ICD-10-CM | POA: Insufficient documentation

## 2020-06-16 LAB — POCT HEMOGLOBIN: Hemoglobin: 14.5 g/dL (ref 11–14.6)

## 2020-06-16 LAB — POCT URINE PREGNANCY: Preg Test, Ur: NEGATIVE

## 2020-06-16 MED ORDER — MEGESTROL ACETATE 40 MG PO TABS
ORAL_TABLET | ORAL | 1 refills | Status: DC
Start: 2020-06-16 — End: 2021-01-31

## 2020-06-16 NOTE — Telephone Encounter (Signed)
Patient states that she received a letter from Dr. Despina Hidden stating that she could go back to work. Pt would like to know if we could revise the letter and add the surgery date on there. I let patient know that Dr. Despina Hidden is not in the office today and he will not be able to do the letter until tomorrow. Pt understood and stated that It will be fine.

## 2020-06-16 NOTE — Progress Notes (Signed)
  Subjective:     Patient ID: Jamie Maynard, female   DOB: 1996-08-02, 24 y.o.   MRN: [id number]  HPI Jamie Maynard is a 24 year old white female,single, G2P2 in complaining of heavy bleeding with periods last 3 months, may last 2 weeks and changes pad and tampon every hour.She had tubal and IUD removed 06/01/20. She mentioned wanting ablation.  PCP is Dr Reuel Boom.   Review of Systems Periods heavy last 3 months May bleed 2 weeks and changes pad and tampon every hour Has mild cramps Has some clotting Has headaches,dizziness and nausea.feels shaky  Reviewed past medical,surgical, social and family history. Reviewed medications and allergies.     Objective:   Physical Exam BP 131/79 (BP Location: Left Arm, Patient Position: Sitting, Cuff Size: Normal)   Pulse 88   Ht 5\' 7"  (1.702 m)   Wt 175 lb (79.4 kg)   LMP 05/20/2020   BMI 27.41 kg/m UPT is negative and HGB finger stick is 14.5.Skin warm and dry.  Lungs: clear to ausculation bilaterally. Cardiovascular: regular rate and rhythm. Declined pelvic.  Upstream - 06/16/20 1136      Pregnancy Intention Screening   Does the patient want to become pregnant in the next year? No    Does the patient's partner want to become pregnant in the next year? No    Would the patient like to discuss contraceptive options today? No      Contraception Wrap Up   Current Method Female Sterilization    End Method Female Sterilization    Contraception Counseling Provided No             Assessment:    1. Menorrhagia with irregular cycle Will rx megace to see if can stop bleeding    Meds ordered this encounter  Medications  . megestrol (MEGACE) 40 MG tablet    Sig: Take 3 x 5 days then 2 x 5 days then 1 daily    Dispense:  45 tablet    Refill:  1    Order Specific Question:   Supervising Provider    Answer:   08/16/20 [[id number]]      Plan:     Return in 1- 2 weeks for GYN Jamie Maynard and then see me 3-4 days later  Gave handout on endometrial  ablation to review

## 2020-06-30 ENCOUNTER — Ambulatory Visit (INDEPENDENT_AMBULATORY_CARE_PROVIDER_SITE_OTHER): Payer: Medicaid Other

## 2020-06-30 DIAGNOSIS — N921 Excessive and frequent menstruation with irregular cycle: Secondary | ICD-10-CM | POA: Diagnosis not present

## 2020-06-30 NOTE — Progress Notes (Signed)
PELVIC US TA/TV:homogeneous anteverted uterus,wnl,EEC 2.2 mm,normal ovaries,ovaries appear mobile,no pain during ultrasound  Chaperone Peggy

## 2020-07-04 ENCOUNTER — Ambulatory Visit: Payer: Medicaid Other | Admitting: Adult Health

## 2020-12-20 ENCOUNTER — Other Ambulatory Visit: Payer: Self-pay

## 2020-12-20 ENCOUNTER — Encounter: Payer: Self-pay | Admitting: Obstetrics & Gynecology

## 2020-12-20 ENCOUNTER — Ambulatory Visit (INDEPENDENT_AMBULATORY_CARE_PROVIDER_SITE_OTHER): Payer: Medicaid Other | Admitting: Obstetrics & Gynecology

## 2020-12-20 VITALS — BP 126/79 | HR 121 | Ht 67.0 in | Wt 184.0 lb

## 2020-12-20 DIAGNOSIS — N920 Excessive and frequent menstruation with regular cycle: Secondary | ICD-10-CM | POA: Diagnosis not present

## 2020-12-20 DIAGNOSIS — N946 Dysmenorrhea, unspecified: Secondary | ICD-10-CM | POA: Diagnosis not present

## 2020-12-20 NOTE — Progress Notes (Signed)
Preoperative History and Physical  Jamie Maynard is a 25 y.o. [phone] with No LMP recorded. admitted for a hysteroscopy uterine curettage Minerva endometrial ablation.  S/p BTL Uterus normal No dyspareunia No chronic pain  PMH:    Past Medical History:  Diagnosis Date  . Medical history non-contributory     PSH:     Past Surgical History:  Procedure Laterality Date  . IUD REMOVAL N/A 06/01/2020   Procedure: INTRAUTERINE DEVICE (IUD) REMOVAL;  Surgeon: Lazaro Arms, MD;  Location: [facility];  Service: Gynecology;  Laterality: N/A;  . LAPAROSCOPIC TUBAL LIGATION Bilateral 06/01/2020   Procedure: LAPAROSCOPIC BILATERAL TUBAL LIGATION USING ELECTROCAUTERY;  Surgeon: Lazaro Arms, MD;  Location: [facility];  Service: Gynecology;  Laterality: Bilateral;  . TONSILLECTOMY AND ADENOIDECTOMY Bilateral 2003   Performed at [facility]    POb/GynH:      OB History    Gravida  2   Para  2   Term  2   Preterm      AB      Living  2     SAB      IAB      Ectopic      Multiple  0   Live Births  2           SH:   Social History   Tobacco Use  . Smoking status: Never Smoker  . Smokeless tobacco: Never Used  Vaping Use  . Vaping Use: Never used  Substance Use Topics  . Alcohol use: No  . Drug use: No    FH:    Family History  Problem Relation Age of Onset  . Depression Father   . Stroke Father   . Hypertension Father   . Heart disease Father   . Heart attack Maternal Grandfather   . Congestive Heart Failure Paternal Grandmother   . Migraines Paternal Grandmother   . COPD Paternal Grandmother   . Lung cancer Paternal Grandfather   . Autism Cousin        2 paternal first cousins have autism  . ADD / ADHD Cousin        Several paternal first cousins have ADHD/ADD     Allergies:  Allergies  Allergen Reactions  . Other Hives and Rash    Glitter make-up causes rash and hives     Medications:       Current Outpatient Medications:  .   megestrol (MEGACE) 40 MG tablet, Take 3 x 5 days then 2 x 5 days then 1 daily (Patient not taking: Reported on 12/20/2020), Disp: 45 tablet, Rfl: 1  Review of Systems:   Review of Systems  Constitutional: Negative for fever, chills, weight loss, malaise/fatigue and diaphoresis.  HENT: Negative for hearing loss, ear pain, nosebleeds, congestion, sore throat, neck pain, tinnitus and ear discharge.   Eyes: Negative for blurred vision, double vision, photophobia, pain, discharge and redness.  Respiratory: Negative for cough, hemoptysis, sputum production, shortness of breath, wheezing and stridor.   Cardiovascular: Negative for chest pain, palpitations, orthopnea, claudication, leg swelling and PND.  Gastrointestinal: Positive for abdominal pain. Negative for heartburn, nausea, vomiting, diarrhea, constipation, blood in stool and melena.  Genitourinary: Negative for dysuria, urgency, frequency, hematuria and flank pain.  Musculoskeletal: Negative for myalgias, back pain, joint pain and falls.  Skin: Negative for itching and rash.  Neurological: Negative for dizziness, tingling, tremors, sensory change, speech change, focal weakness, seizures, loss of consciousness, weakness and headaches.  Endo/Heme/Allergies: Negative  for environmental allergies and polydipsia. Does not bruise/bleed easily.  Psychiatric/Behavioral: Negative for depression, suicidal ideas, hallucinations, memory loss and substance abuse. The patient is not nervous/anxious and does not have insomnia.      PHYSICAL EXAM:  Blood pressure 126/79, pulse (!) 121, height 5\' 7"  (1.702 m), weight 184 lb (83.5 kg), not currently breastfeeding.    Vitals reviewed. Constitutional: She is oriented to person, place, and time. She appears well-developed and well-nourished.  HENT:  Head: Normocephalic and atraumatic.  Right Ear: External ear normal.  Left Ear: External ear normal.  Nose: Nose normal.  Mouth/Throat: Oropharynx is clear  and moist.  Eyes: Conjunctivae and EOM are normal. Pupils are equal, round, and reactive to light. Right eye exhibits no discharge. Left eye exhibits no discharge. No scleral icterus.  Neck: Normal range of motion. Neck supple. No tracheal deviation present. No thyromegaly present.  Cardiovascular: Normal rate, regular rhythm, normal heart sounds and intact distal pulses.  Exam reveals no gallop and no friction rub.   No murmur heard. Respiratory: Effort normal and breath sounds normal. No respiratory distress. She has no wheezes. She has no rales. She exhibits no tenderness.  GI: Soft. Bowel sounds are normal. She exhibits no distension and no mass. There is tenderness. There is no rebound and no guarding.  Genitourinary:       Vulva is normal without lesions Vagina is pink moist without discharge Cervix normal in appearance and pap is normal Uterus is normal size, contour, position, consistency, mobility, non-tender Adnexa is negative with normal sized ovaries by sonogram  Musculoskeletal: Normal range of motion. She exhibits no edema and no tenderness.  Neurological: She is alert and oriented to person, place, and time. She has normal reflexes. She displays normal reflexes. No cranial nerve deficit. She exhibits normal muscle tone. Coordination normal.  Skin: Skin is warm and dry. No rash noted. No erythema. No pallor.  Psychiatric: She has a normal mood and affect. Her behavior is normal. Judgment and thought content normal.    Labs: No results found for this or any previous visit (from the past 336 hour(s)).  EKG: Orders placed or performed during the hospital encounter of 05/04/18  . ED EKG  . EKG 12-Lead  . EKG 12-Lead  . ED EKG  . EKG    Imaging Studies: No results found.    Assessment:   ICD-10-CM   1. Menorrhagia with regular cycle  N92.0   2. Dysmenorrhea  N94.6     Plan: Hysteroscopy uterine curettage Minerva endometrial ablation  05/06/18 12/20/2020 4:30 PM

## 2021-02-02 NOTE — Patient Instructions (Addendum)
Your procedure is scheduled on: 02/08/2021  Report to [facility] at  9:40   AM.  Call this number if you have problems the morning of surgery: [phone]   Remember:   Do not Eat or Drink after midnight         No Smoking the morning of surgery  :  Take these medicines the morning of surgery with A SIP OF WATER: none   May use flonase if needed   Do not wear jewelry, make-up or nail polish.  Do not wear lotions, powders, or perfumes. You may wear deodorant.  Do not shave 48 hours prior to surgery. Men may shave face and neck.  Do not bring valuables to the hospital.  Contacts, dentures or bridgework may not be worn into surgery.  Leave suitcase in the car. After surgery it may be brought to your room.  For patients admitted to the hospital, checkout time is 11:00 AM the day of discharge.   Patients discharged the day of surgery will not be allowed to drive home.    Special Instructions: Shower using CHG night before surgery and shower the day of surgery use CHG.  Use special wash - you have one bottle of CHG for all showers.  You should use approximately 1/2 of the bottle for each shower.  How to Use Chlorhexidine for Bathing Chlorhexidine gluconate (CHG) is a germ-killing (antiseptic) solution that is used to clean the skin. It can get rid of the bacteria that normally live on the skin and can keep them away for about 24 hours. To clean your skin with CHG, you may be given:  A CHG solution to use in the shower or as part of a sponge bath.  A prepackaged cloth that contains CHG. Cleaning your skin with CHG may help lower the risk for infection:  While you are staying in the intensive care unit of the hospital.  If you have a vascular access, such as a central line, to provide short-term or long-term access to your veins.  If you have a catheter to drain urine from your bladder.  If you are on a ventilator. A ventilator is a machine that helps you breathe by moving air in and  out of your lungs.  After surgery. What are the risks? Risks of using CHG include:  A skin reaction.  Hearing loss, if CHG gets in your ears.  Eye injury, if CHG gets in your eyes and is not rinsed out.  The CHG product catching fire. Make sure that you avoid smoking and flames after applying CHG to your skin. Do not use CHG:  If you have a chlorhexidine allergy or have previously reacted to chlorhexidine.  On babies younger than 69 months of age. How to use CHG solution  Use CHG only as told by your health care provider, and follow the instructions on the label.  Use the full amount of CHG as directed. Usually, this is one bottle. During a shower Follow these steps when using CHG solution during a shower (unless your health care provider gives you different instructions): 1. Start the shower. 2. Use your normal soap and shampoo to wash your face and hair. 3. Turn off the shower or move out of the shower stream. 4. Pour the CHG onto a clean washcloth. Do not use any type of brush or rough-edged sponge. 5. Starting at your neck, lather your body down to your toes. Make sure you follow these instructions: ? If you  will be having surgery, pay special attention to the part of your body where you will be having surgery. Scrub this area for at least 1 minute. ? Do not use CHG on your head or face. If the solution gets into your ears or eyes, rinse them well with water. ? Avoid your genital area. ? Avoid any areas of skin that have broken skin, cuts, or scrapes. ? Scrub your back and under your arms. Make sure to wash skin folds. 6. Let the lather sit on your skin for 1-2 minutes or as long as told by your health care provider. 7. Thoroughly rinse your entire body in the shower. Make sure that all body creases and crevices are rinsed well. 8. Dry off with a clean towel. Do not put any substances on your body afterward--such as powder, lotion, or perfume--unless you are told to do so by  your health care provider. Only use lotions that are recommended by the manufacturer. 9. Put on clean clothes or pajamas. 10. If it is the night before your surgery, sleep in clean sheets.   During a sponge bath Follow these steps when using CHG solution during a sponge bath (unless your health care provider gives you different instructions): 1. Use your normal soap and shampoo to wash your face and hair. 2. Pour the CHG onto a clean washcloth. 3. Starting at your neck, lather your body down to your toes. Make sure you follow these instructions: ? If you will be having surgery, pay special attention to the part of your body where you will be having surgery. Scrub this area for at least 1 minute. ? Do not use CHG on your head or face. If the solution gets into your ears or eyes, rinse them well with water. ? Avoid your genital area. ? Avoid any areas of skin that have broken skin, cuts, or scrapes. ? Scrub your back and under your arms. Make sure to wash skin folds. 4. Let the lather sit on your skin for 1-2 minutes or as long as told by your health care provider. 5. Using a different clean, wet washcloth, thoroughly rinse your entire body. Make sure that all body creases and crevices are rinsed well. 6. Dry off with a clean towel. Do not put any substances on your body afterward--such as powder, lotion, or perfume--unless you are told to do so by your health care provider. Only use lotions that are recommended by the manufacturer. 7. Put on clean clothes or pajamas. 8. If it is the night before your surgery, sleep in clean sheets. How to use CHG prepackaged cloths  Only use CHG cloths as told by your health care provider, and follow the instructions on the label.  Use the CHG cloth on clean, dry skin.  Do not use the CHG cloth on your head or face unless your health care provider tells you to.  When washing with the CHG cloth: ? Avoid your genital area. ? Avoid any areas of skin that have  broken skin, cuts, or scrapes. Before surgery Follow these steps when using a CHG cloth to clean before surgery (unless your health care provider gives you different instructions): 1. Using the CHG cloth, vigorously scrub the part of your body where you will be having surgery. Scrub using a back-and-forth motion for 3 minutes. The area on your body should be completely wet with CHG when you are done scrubbing. 2. Do not rinse. Discard the cloth and let the area air-dry. Do  not put any substances on the area afterward, such as powder, lotion, or perfume. 3. Put on clean clothes or pajamas. 4. If it is the night before your surgery, sleep in clean sheets.   For general bathing Follow these steps when using CHG cloths for general bathing (unless your health care provider gives you different instructions). 1. Use a separate CHG cloth for each area of your body. Make sure you wash between any folds of skin and between your fingers and toes. Wash your body in the following order, switching to a new cloth after each step: ? The front of your neck, shoulders, and chest. ? Both of your arms, under your arms, and your hands. ? Your stomach and groin area, avoiding the genitals. ? Your right leg and foot. ? Your left leg and foot. ? The back of your neck, your back, and your buttocks. 2. Do not rinse. Discard the cloth and let the area air-dry. Do not put any substances on your body afterward--such as powder, lotion, or perfume--unless you are told to do so by your health care provider. Only use lotions that are recommended by the manufacturer. 3. Put on clean clothes or pajamas. Contact a health care provider if:  Your skin gets irritated after scrubbing.  You have questions about using your solution or cloth. Get help right away if:  Your eyes become very red or swollen.  Your eyes itch badly.  Your skin itches badly and is red or swollen.  Your hearing changes.  You have trouble  seeing.  You have swelling or tingling in your mouth or throat.  You have trouble breathing.  You swallow any chlorhexidine. Summary  Chlorhexidine gluconate (CHG) is a germ-killing (antiseptic) solution that is used to clean the skin. Cleaning your skin with CHG may help to lower your risk for infection.  You may be given CHG to use for bathing. It may be in a bottle or in a prepackaged cloth to use on your skin. Carefully follow your health care provider's instructions and the instructions on the product label.  Do not use CHG if you have a chlorhexidine allergy.  Contact your health care provider if your skin gets irritated after scrubbing. This information is not intended to replace advice given to you by your health care provider. Make sure you discuss any questions you have with your health care provider. Document Revised: 04/08/2020 Document Reviewed: 04/08/2020 Elsevier Patient Education  2021 [employer]  Endometrial Ablation Endometrial ablation is a procedure that destroys the thin inner layer of the lining of the uterus (endometrium). This procedure may be done:  To stop heavy menstrual periods.  To stop bleeding that is causing anemia.  To control irregular bleeding.  To treat bleeding caused by small tumors (fibroids) in the endometrium. This procedure is often done as an alternative to major surgery, such as removal of the uterus and cervix (hysterectomy). As a result of this procedure:  You may not be able to have children. However, if you have not yet gone through menopause: ? You may still have a small chance of getting pregnant. ? You will need to use a reliable method of birth control after the procedure to prevent pregnancy.  You may stop having a menstrual period, or you may have only a small amount of bleeding during your period. Menstruation may return several years after the procedure. Tell a health care provider about:  Any allergies you  have.  All medicines you are taking,  including vitamins, herbs, eye drops, creams, and over-the-counter medicines.  Any problems you or family members have had with the use of anesthetic medicines.  Any blood disorders you have.  Any surgeries you have had.  Any medical conditions you have.  Whether you are pregnant or may be pregnant. What are the risks? Generally, this is a safe procedure. However, problems may occur, including:  A hole (perforation) in the uterus or bowel.  Infection in the uterus, bladder, or vagina.  Bleeding.  Allergic reaction to medicines.  Damage to nearby structures or organs.  An air bubble in the lung (air embolus).  Problems with pregnancy.  Failure of the procedure.  Decreased ability to diagnose cancer in the endometrium. Scar tissue forms after the procedure, making it more difficult to get a sample of the uterine lining. What happens before the procedure? Medicines Ask your health care provider about:  Changing or stopping your regular medicines. This is especially important if you take diabetes medicines or blood thinners.  Taking medicines such as aspirin and ibuprofen. These medicines can thin your blood. Do not take these medicines before your procedure if your doctor tells you not to take them.  Taking over-the-counter medicines, vitamins, herbs, and supplements. Tests  You will have tests of your endometrium to make sure there are no precancerous cells or cancer cells present.  You may have an ultrasound of the uterus. General instructions  Do not use any products that contain nicotine or tobacco for at least 4 weeks before the procedure. These include cigarettes, chewing tobacco, and vaping devices, such as e-cigarettes. If you need help quitting, ask your health care provider.  You may be given medicines to thin the endometrium.  Ask your health care provider what steps will be taken to help prevent infection. These steps  may include: ? Removing hair at the surgery site. ? Washing skin with a germ-killing soap. ? Taking antibiotic medicine.  Plan to have a responsible adult take you home from the hospital or clinic.  Plan to have a responsible adult care for you for the time you are told after you leave the hospital or clinic. This is important. What happens during the procedure?  You will lie on an exam table with your feet and legs supported as in a pelvic exam.  An IV will be inserted into one of your veins.  You will be given a medicine to help you relax (sedative).  A surgical tool with a light and camera (resectoscope) will be inserted into your vagina and moved into your uterus. This allows your surgeon to see inside your uterus.  Endometrial tissue will be destroyed and removed, using one of the following methods: ? Radiofrequency. This uses an electrical current to destroy the endometrium. ? Cryotherapy. This uses extreme cold to freeze the endometrium. ? Heated fluid. This uses a heated salt and water (saline) solution to destroy the endometrium. ? Microwave. This uses high-energy microwaves to heat up the endometrium and destroy it. ? Thermal balloon. This involves inserting a catheter with a balloon tip into the uterus. The balloon tip is filled with heated fluid to destroy the endometrium. The procedure may vary among health care providers and hospitals.   What happens after the procedure?  Your blood pressure, heart rate, breathing rate, and blood oxygen level will be monitored until you leave the hospital or clinic.  You may have vaginal bleeding for 4-6 weeks after the procedure. You may also have: ? Cramps. ?  A thin, watery vaginal discharge that is light pink or brown. ? A need to urinate more than usual. ? Nausea.  If you were given a sedative during the procedure, it can affect you for several hours. Do not drive or operate machinery until your health care provider says that it  is safe.  Do not have sex or insert anything into your vagina until your health care provider says it is safe. Summary  Endometrial ablation is done to treat many causes of heavy menstrual bleeding. The procedure destroys the thin inner layer of the lining of the uterus (endometrium).  This procedure is often done as an alternative to major surgery, such as removal of the uterus and cervix (hysterectomy).  Plan to have a responsible adult take you home from the hospital or clinic. This information is not intended to replace advice given to you by your health care provider. Make sure you discuss any questions you have with your health care provider. Document Revised: 05/12/2020 Document Reviewed: 05/12/2020 Elsevier Patient Education  2021 [employer] Inc.  Dilation and Curettage, Care After This sheet gives you information about how to care for yourself after your procedure. Your doctor may also give you more specific instructions. If you have problems or questions, contact your doctor. What can I expect after the procedure? After the procedure, it is common to have:  Mild pain or cramping.  Some bleeding or spotting from the vagina. These may last for up to 2 weeks. Follow these instructions at home: Medicines  Take over-the-counter and prescription medicines only as told by your doctor. This is very important if you take blood-thinning medicine.  Ask your doctor if the medicine prescribed to you requires you to avoid driving or using machinery. Activity  If you were given a medicine to help you relax (sedative) during your procedure, it can affect you for many hours. Do not drive or use machinery until your doctor says that it is safe.  Rest as told by your doctor.  Do not sit for a long time without moving. Get up to take short walks every 1-2 hours. This is important. Ask for help if you feel weak or unsteady.  Do not lift anything that is heavier than 10 lb (4.5 kg), or the  limit that you are told, until your doctor says that it is safe.  Return to your normal activities as told by your doctor. Ask your doctor what activities are safe for you.   Lifestyle For at least 2 weeks, or as long as told by your doctor:  Do not douche.  Do not use tampons.  Do not have sex. General instructions  Wear compression stockings as told by your doctor.  It is up to you to get the results of your procedure. Ask your doctor, or the department that is doing the procedure, when your results will be ready.  Keep all follow-up visits as told by your doctor. This is important. Contact a doctor if:  You have very bad cramps that get worse or do not get better with medicine.  You have very bad pain in your belly (abdomen).  You cannot drink fluids without vomiting.  You have pain in a different part of your pelvis. The pelvis is the area just above your thighs.  You have fluid from your vagina that smells bad.  You have a rash. Get help right away if:  You are bleeding a lot from your vagina. A lot of bleeding means soaking  more than one sanitary pad in 1 hour for 2 hours in a row.  You have a fever that is above 100.60F (38.0C).  Your belly feels very tender or hard.  You have chest pain.  You have trouble breathing.  You feel dizzy.  You feel light-headed.  You pass out (faint).  You have pain in your neck or shoulder area. These symptoms may be an emergency. Do not wait to see if the symptoms will go away. Get medical help right away. Call your local emergency services (911 in the [place] Do not drive yourself to the hospital. Summary  After your procedure, it is common to have pain or cramping. It is also common to have bleeding or spotting from your vagina.  Rest as told. Do not sit for a long time without moving. Get up to take short walks every 1-2 hours.  Do not lift anything that is heavier than 10 lb (4.5 kg), or the limit that you are  told.  Contact your doctor if you have fluid from your vagina that smells bad.  Get help right away if you develop any problems from the procedure. Ask your doctor what problems to watch for. This information is not intended to replace advice given to you by your health care provider. Make sure you discuss any questions you have with your health care provider. Document Revised: 11/24/2019 Document Reviewed: 11/24/2019 Elsevier Patient Education  2021 [employer] Inc.  General Anesthesia, Adult, Care After This sheet gives you information about how to care for yourself after your procedure. Your health care provider may also give you more specific instructions. If you have problems or questions, contact your health care provider. What can I expect after the procedure? After the procedure, the following side effects are common:  Pain or discomfort at the IV site.  Nausea.  Vomiting.  Sore throat.  Trouble concentrating.  Feeling cold or chills.  Feeling weak or tired.  Sleepiness and fatigue.  Soreness and body aches. These side effects can affect parts of the body that were not involved in surgery. Follow these instructions at home: For the time period you were told by your health care provider:  Rest.  Do not participate in activities where you could fall or become injured.  Do not drive or use machinery.  Do not drink alcohol.  Do not take sleeping pills or medicines that cause drowsiness.  Do not make important decisions or sign legal documents.  Do not take care of children on your own.   Eating and drinking  Follow any instructions from your health care provider about eating or drinking restrictions.  When you feel hungry, start by eating small amounts of foods that are soft and easy to digest (bland), such as toast. Gradually return to your regular diet.  Drink enough fluid to keep your urine pale yellow.  If you vomit, rehydrate by drinking water, juice, or  clear broth. General instructions  If you have sleep apnea, surgery and certain medicines can increase your risk for breathing problems. Follow instructions from your health care provider about wearing your sleep device: ? Anytime you are sleeping, including during daytime naps. ? While taking prescription pain medicines, sleeping medicines, or medicines that make you drowsy.  Have a responsible adult stay with you for the time you are told. It is important to have someone help care for you until you are awake and alert.  Return to your normal activities as told by your health care  provider. Ask your health care provider what activities are safe for you.  Take over-the-counter and prescription medicines only as told by your health care provider.  If you smoke, do not smoke without supervision.  Keep all follow-up visits as told by your health care provider. This is important. Contact a health care provider if:  You have nausea or vomiting that does not get better with medicine.  You cannot eat or drink without vomiting.  You have pain that does not get better with medicine.  You are unable to pass urine.  You develop a skin rash.  You have a fever.  You have redness around your IV site that gets worse. Get help right away if:  You have difficulty breathing.  You have chest pain.  You have blood in your urine or stool, or you vomit blood. Summary  After the procedure, it is common to have a sore throat or nausea. It is also common to feel tired.  Have a responsible adult stay with you for the time you are told. It is important to have someone help care for you until you are awake and alert.  When you feel hungry, start by eating small amounts of foods that are soft and easy to digest (bland), such as toast. Gradually return to your regular diet.  Drink enough fluid to keep your urine pale yellow.  Return to your normal activities as told by your health care provider.  Ask your health care provider what activities are safe for you. This information is not intended to replace advice given to you by your health care provider. Make sure you discuss any questions you have with your health care provider. Document Revised: 07/07/2020 Document Reviewed: 02/04/2020 Elsevier Patient Education  2021 [employer]

## 2021-02-06 ENCOUNTER — Other Ambulatory Visit: Payer: Self-pay

## 2021-02-06 ENCOUNTER — Other Ambulatory Visit: Payer: Self-pay | Admitting: Obstetrics & Gynecology

## 2021-02-06 ENCOUNTER — Encounter (HOSPITAL_COMMUNITY)
Admission: RE | Admit: 2021-02-06 | Discharge: 2021-02-06 | Disposition: A | Discharge status: Home / Self Care | Payer: Medicaid Other | Source: Ambulatory Visit | Attending: Obstetrics & Gynecology | Admitting: Obstetrics & Gynecology

## 2021-02-06 ENCOUNTER — Encounter (HOSPITAL_COMMUNITY): Payer: Self-pay

## 2021-02-06 ENCOUNTER — Other Ambulatory Visit (HOSPITAL_COMMUNITY)
Admission: RE | Admit: 2021-02-06 | Discharge: 2021-02-06 | Disposition: A | Discharge status: Home / Self Care | Payer: Medicaid Other | Source: Ambulatory Visit | Attending: Obstetrics & Gynecology | Admitting: Obstetrics & Gynecology

## 2021-02-06 DIAGNOSIS — Z20822 Contact with and (suspected) exposure to covid-19: Secondary | ICD-10-CM | POA: Diagnosis not present

## 2021-02-06 DIAGNOSIS — Z01812 Encounter for preprocedural laboratory examination: Secondary | ICD-10-CM | POA: Insufficient documentation

## 2021-02-06 LAB — RAPID HIV SCREEN (HIV 1/2 AB+AG)
HIV 1/2 Antibodies: NONREACTIVE
HIV-1 P24 Antigen - HIV24: NONREACTIVE

## 2021-02-06 LAB — COMPREHENSIVE METABOLIC PANEL
ALT: 41 U/L (ref 0–44)
AST: 23 U/L (ref 15–41)
Albumin: 4.4 g/dL (ref 3.5–5.0)
Alkaline Phosphatase: 59 U/L (ref 38–126)
Anion gap: 9 (ref 5–15)
BUN: 10 mg/dL (ref 6–20)
CO2: 25 mmol/L (ref 22–32)
Calcium: 9.3 mg/dL (ref 8.9–10.3)
Chloride: 106 mmol/L (ref 98–111)
Creatinine, Ser: 0.67 mg/dL (ref 0.44–1.00)
GFR, Estimated: >60 mL/min (ref >60)
Glucose, Bld: 89 mg/dL (ref 70–99)
Potassium: 3.6 mmol/L (ref 3.5–5.1)
Sodium: 140 mmol/L (ref 135–145)
Total Bilirubin: 0.6 mg/dL (ref 0.3–1.2)
Total Protein: 7.3 g/dL (ref 6.5–8.1)

## 2021-02-06 LAB — CBC
HCT: 43.9 % (ref 36.0–46.0)
Hemoglobin: 14.4 g/dL (ref 12.0–15.0)
MCH: 29.7 pg (ref 26.0–34.0)
MCHC: 32.8 g/dL (ref 30.0–36.0)
MCV: 90.5 fL (ref 80.0–100.0)
Platelets: 283 10*3/uL (ref 150–400)
RBC: 4.85 MIL/uL (ref 3.87–5.11)
RDW: 12.3 % (ref 11.5–15.5)
WBC: 7.2 10*3/uL (ref 4.0–10.5)
nRBC: 0 % (ref 0.0–0.2)

## 2021-02-06 LAB — URINALYSIS, ROUTINE W REFLEX MICROSCOPIC
Bilirubin Urine: NEGATIVE
Glucose, UA: 50 mg/dL — AB
Ketones, ur: 5 mg/dL — AB
Leukocytes,Ua: NEGATIVE
Nitrite: NEGATIVE
Protein, ur: 100 mg/dL — AB
RBC / HPF: >50 RBC/hpf — ABNORMAL HIGH (ref 0–5)
Specific Gravity, Urine: 1.026 (ref 1.005–1.030)
pH: 6 (ref 5.0–8.0)

## 2021-02-06 LAB — HCG, QUANTITATIVE, PREGNANCY: hCG, Beta Chain, Quant, S: <1 m[IU]/mL (ref <5)

## 2021-02-07 LAB — SARS CORONAVIRUS 2 (TAT 6-24 HRS): SARS Coronavirus 2: NEGATIVE

## 2021-02-08 ENCOUNTER — Ambulatory Visit (HOSPITAL_COMMUNITY): Payer: Medicaid Other | Admitting: Certified Registered Nurse Anesthetist

## 2021-02-08 ENCOUNTER — Ambulatory Visit (HOSPITAL_COMMUNITY)
Admission: RE | Admit: 2021-02-08 | Discharge: 2021-02-08 | Disposition: A | Discharge status: Home / Self Care | Payer: Medicaid Other | Attending: Obstetrics & Gynecology | Admitting: Obstetrics & Gynecology

## 2021-02-08 ENCOUNTER — Encounter (HOSPITAL_COMMUNITY)
Admission: RE | Disposition: A | Discharge status: Home / Self Care | Payer: Self-pay | Source: Home / Self Care | Attending: Obstetrics & Gynecology

## 2021-02-08 ENCOUNTER — Encounter (HOSPITAL_COMMUNITY): Payer: Self-pay | Admitting: Obstetrics & Gynecology

## 2021-02-08 DIAGNOSIS — N840 Polyp of corpus uteri: Secondary | ICD-10-CM | POA: Diagnosis not present

## 2021-02-08 DIAGNOSIS — N920 Excessive and frequent menstruation with regular cycle: Secondary | ICD-10-CM

## 2021-02-08 DIAGNOSIS — N946 Dysmenorrhea, unspecified: Secondary | ICD-10-CM

## 2021-02-08 HISTORY — PX: DILATATION AND CURETTAGE/HYSTEROSCOPY WITH MINERVA: SHX6851

## 2021-02-08 SURGERY — DILATATION AND CURETTAGE/HYSTEROSCOPY WITH MINERVA
Anesthesia: General | Site: Uterus

## 2021-02-08 MED ORDER — KETOROLAC TROMETHAMINE 30 MG/ML IJ SOLN: 30.0000 mg | Freq: Once | INTRAMUSCULAR | Status: AC

## 2021-02-08 MED ORDER — ONDANSETRON HCL 4 MG/2ML IJ SOLN
INTRAMUSCULAR | Status: DC | PRN
  Administered 2021-02-08: 4 mg via INTRAVENOUS

## 2021-02-08 MED ORDER — KETOROLAC TROMETHAMINE 10 MG PO TABS: 10.0000 mg | ORAL_TABLET | Freq: Three times a day (TID) | ORAL | 0 refills | Status: DC | PRN

## 2021-02-08 MED ORDER — SODIUM CHLORIDE 0.9 % IR SOLN
Status: DC | PRN
  Administered 2021-02-08: 3000 mL

## 2021-02-08 MED ORDER — CEFAZOLIN SODIUM-DEXTROSE 2-4 GM/100ML-% IV SOLN
INTRAVENOUS | Status: AC
  Filled 2021-02-08: qty 100

## 2021-02-08 MED ORDER — LIDOCAINE HCL (CARDIAC) PF 100 MG/5ML IV SOSY
PREFILLED_SYRINGE | INTRAVENOUS | Status: DC | PRN
  Administered 2021-02-08: 80 mg via INTRATRACHEAL

## 2021-02-08 MED ORDER — MIDAZOLAM HCL 5 MG/5ML IJ SOLN
INTRAMUSCULAR | Status: DC | PRN
  Administered 2021-02-08: 2 mg via INTRAVENOUS

## 2021-02-08 MED ORDER — FENTANYL CITRATE (PF) 100 MCG/2ML IJ SOLN
INTRAMUSCULAR | Status: AC
  Filled 2021-02-08: qty 2

## 2021-02-08 MED ORDER — PROPOFOL 10 MG/ML IV BOLUS
INTRAVENOUS | Status: AC
  Filled 2021-02-08: qty 20

## 2021-02-08 MED ORDER — FENTANYL CITRATE (PF) 100 MCG/2ML IJ SOLN
25.0000 ug | INTRAMUSCULAR | Status: DC | PRN
  Administered 2021-02-08: 50 ug via INTRAVENOUS
  Filled 2021-02-08: qty 2

## 2021-02-08 MED ORDER — CHLORHEXIDINE GLUCONATE 0.12 % MT SOLN
15.0000 mL | Freq: Once | OROMUCOSAL | Status: AC
  Administered 2021-02-08: 15 mL via OROMUCOSAL

## 2021-02-08 MED ORDER — HYDROCODONE-ACETAMINOPHEN 5-325 MG PO TABS: 1.0000 | ORAL_TABLET | Freq: Four times a day (QID) | ORAL | 0 refills | Status: DC | PRN

## 2021-02-08 MED ORDER — KETOROLAC TROMETHAMINE 30 MG/ML IJ SOLN
INTRAMUSCULAR | Status: AC
  Administered 2021-02-08: 30 mg
  Filled 2021-02-08: qty 1

## 2021-02-08 MED ORDER — ONDANSETRON 8 MG PO TBDP: 8.0000 mg | ORAL_TABLET | Freq: Three times a day (TID) | ORAL | 0 refills | Status: DC | PRN

## 2021-02-08 MED ORDER — MIDAZOLAM HCL 2 MG/2ML IJ SOLN
INTRAMUSCULAR | Status: AC
  Filled 2021-02-08: qty 2

## 2021-02-08 MED ORDER — ORAL CARE MOUTH RINSE: 15.0000 mL | Freq: Once | OROMUCOSAL | Status: AC

## 2021-02-08 MED ORDER — LACTATED RINGERS IV SOLN
INTRAVENOUS | Status: DC
  Administered 2021-02-08: 1000 mL via INTRAVENOUS

## 2021-02-08 MED ORDER — 0.9 % SODIUM CHLORIDE (POUR BTL) OPTIME
TOPICAL | Status: DC | PRN
  Administered 2021-02-08: 1000 mL

## 2021-02-08 MED ORDER — PROPOFOL 10 MG/ML IV BOLUS
INTRAVENOUS | Status: DC | PRN
  Administered 2021-02-08: 50 mg via INTRAVENOUS
  Administered 2021-02-08: 200 mg via INTRAVENOUS

## 2021-02-08 MED ORDER — FENTANYL CITRATE (PF) 100 MCG/2ML IJ SOLN
INTRAMUSCULAR | Status: DC | PRN
  Administered 2021-02-08: 50 ug via INTRAVENOUS

## 2021-02-08 MED ORDER — ONDANSETRON HCL 4 MG/2ML IJ SOLN: 4.0000 mg | Freq: Once | INTRAMUSCULAR | Status: DC | PRN

## 2021-02-08 MED ORDER — POVIDONE-IODINE 10 % EX SWAB: 2.0000 "application " | Freq: Once | CUTANEOUS | Status: DC

## 2021-02-08 MED ORDER — DEXAMETHASONE SODIUM PHOSPHATE 10 MG/ML IJ SOLN
INTRAMUSCULAR | Status: DC | PRN
  Administered 2021-02-08: 10 mg via INTRAVENOUS

## 2021-02-08 MED ORDER — CEFAZOLIN SODIUM-DEXTROSE 2-4 GM/100ML-% IV SOLN
2.0000 g | INTRAVENOUS | Status: AC
  Administered 2021-02-08: 2 g via INTRAVENOUS

## 2021-02-08 SURGICAL SUPPLY — 27 items
BAG HAMPER (MISCELLANEOUS) ×2 IMPLANT
CLOTH BEACON ORANGE TIMEOUT ST (SAFETY) ×2 IMPLANT
COVER LIGHT HANDLE STERIS (MISCELLANEOUS) ×4 IMPLANT
COVER WAND RF STERILE (DRAPES) ×2 IMPLANT
GAUZE 4X4 16PLY RFD (DISPOSABLE) ×4 IMPLANT
GLOVE ECLIPSE 8.0 STRL XLNG CF (GLOVE) ×2 IMPLANT
GLOVE SRG 8 PF TXTR STRL LF DI (GLOVE) ×1 IMPLANT
GLOVE SURG UNDER POLY LF SZ7 (GLOVE) ×4 IMPLANT
GLOVE SURG UNDER POLY LF SZ8 (GLOVE) ×2
GOWN STRL REUS W/TWL LRG LVL3 (GOWN DISPOSABLE) ×2 IMPLANT
GOWN STRL REUS W/TWL XL LVL3 (GOWN DISPOSABLE) ×2 IMPLANT
HANDPIECE ABLA MINERVA ENDO (MISCELLANEOUS) ×2 IMPLANT
INST SET HYSTEROSCOPY (KITS) ×2 IMPLANT
IV NS 1000ML (IV SOLUTION) ×2
IV NS 1000ML BAXH (IV SOLUTION) ×1 IMPLANT
IV NS IRRIG 3000ML ARTHROMATIC (IV SOLUTION) ×1 IMPLANT
KIT TURNOVER CYSTO (KITS) ×2 IMPLANT
MANIFOLD NEPTUNE II (INSTRUMENTS) ×2 IMPLANT
MARKER SKIN DUAL TIP RULER LAB (MISCELLANEOUS) ×2 IMPLANT
NS IRRIG 1000ML POUR BTL (IV SOLUTION) ×2 IMPLANT
PACK BASIC III (CUSTOM PROCEDURE TRAY) ×2
PACK SRG BSC III STRL LF ECLPS (CUSTOM PROCEDURE TRAY) ×1 IMPLANT
PAD ARMBOARD 7.5X6 YLW CONV (MISCELLANEOUS) ×2 IMPLANT
PAD TELFA 3X4 1S STER (GAUZE/BANDAGES/DRESSINGS) ×2 IMPLANT
SET BASIN LINEN APH (SET/KITS/TRAYS/PACK) ×2 IMPLANT
SET CYSTO W/LG BORE CLAMP LF (SET/KITS/TRAYS/PACK) ×2 IMPLANT
SHEET LAVH (DRAPES) ×2 IMPLANT

## 2021-02-08 NOTE — Anesthesia Postprocedure Evaluation (Signed)
Anesthesia Post Note  Patient: Jamie Maynard  Procedure(s) Performed: DILATATION AND CURETTAGE/HYSTEROSCOPY WITH MINERVA (N/A Uterus)  Patient location during evaluation: PACU Anesthesia Type: General Level of consciousness: awake Pain management: pain level controlled Vital Signs Assessment: post-procedure vital signs reviewed and stable Respiratory status: spontaneous breathing Cardiovascular status: stable Postop Assessment: no apparent nausea or vomiting Anesthetic complications: no   No complications documented.   Last Vitals:  Vitals:   02/08/21 1007  Resp: 20  Temp: 36.8 C  SpO2: 100%    Last Pain:  Vitals:   02/08/21 1007  TempSrc: Oral  PainSc: 2                  Jalena Vanderlinden Hristova

## 2021-02-08 NOTE — Anesthesia Preprocedure Evaluation (Signed)
Anesthesia Evaluation  Patient identified by MRN, date of birth, ID band Patient awake    Reviewed: Allergy & Precautions, H&P , NPO status , Patient's Chart, lab work & pertinent test results, reviewed documented beta blocker date and time   Airway Mallampati: II  TM Distance: >3 FB Neck ROM: full    Dental no notable dental hx.    Pulmonary neg pulmonary ROS,    Pulmonary exam normal breath sounds clear to auscultation       Cardiovascular Exercise Tolerance: Good negative cardio ROS   Rhythm:regular Rate:Normal     Neuro/Psych negative neurological ROS  negative psych ROS   GI/Hepatic negative GI ROS, Neg liver ROS,   Endo/Other  negative endocrine ROS  Renal/GU negative Renal ROS  negative genitourinary   Musculoskeletal negative musculoskeletal ROS (+)   Abdominal   Peds negative pediatric ROS (+)  Hematology negative hematology ROS (+)   Anesthesia Other Findings   Reproductive/Obstetrics negative OB ROS                             Anesthesia Physical Anesthesia Plan  ASA: I  Anesthesia Plan: General and General LMA   Post-op Pain Management:    Induction:   PONV Risk Score and Plan:   Airway Management Planned:   Additional Equipment:   Intra-op Plan:   Post-operative Plan:   Informed Consent: I have reviewed the patients History and Physical, chart, labs and discussed the procedure including the risks, benefits and alternatives for the proposed anesthesia with the patient or authorized representative who has indicated his/her understanding and acceptance.     Dental Advisory Given  Plan Discussed with: CRNA  Anesthesia Plan Comments:         Anesthesia Quick Evaluation

## 2021-02-08 NOTE — Anesthesia Procedure Notes (Signed)
Procedure Name: LMA Insertion Date/Time: 02/08/2021 10:37 AM Performed by: Elgie Congo, CRNA Pre-anesthesia Checklist: Patient identified, Emergency Drugs available, Suction available and Patient being monitored Patient Re-evaluated:Patient Re-evaluated prior to induction Oxygen Delivery Method: Circle system utilized Preoxygenation: Pre-oxygenation with 100% oxygen Induction Type: IV induction Ventilation: Mask ventilation without difficulty LMA: LMA inserted LMA Size: 4.0 Number of attempts: 1 Airway Equipment and Method: Oral airway Placement Confirmation: positive ETCO2 and breath sounds checked- equal and bilateral Tube secured with: Tape Dental Injury: Teeth and Oropharynx as per pre-operative assessment

## 2021-02-08 NOTE — H&P (Signed)
Preoperative History and Physical  Jamie Maynard is a 25 y.o. [phone] with Patient's last menstrual period was 02/06/2021. admitted for a hysteroscopy uterine curettage Minerva endometrial ablation for menorrhagia dn dysmenorrhea S/p BTL Uterus normal No dyspareunia No chronic pain  PMH:    Past Medical History:  Diagnosis Date  . Medical history non-contributory     PSH:     Past Surgical History:  Procedure Laterality Date  . IUD REMOVAL N/A 06/01/2020   Procedure: INTRAUTERINE DEVICE (IUD) REMOVAL;  Surgeon: Lazaro Arms, MD;  Location: [facility];  Service: Gynecology;  Laterality: N/A;  . LAPAROSCOPIC TUBAL LIGATION Bilateral 06/01/2020   Procedure: LAPAROSCOPIC BILATERAL TUBAL LIGATION USING ELECTROCAUTERY;  Surgeon: Lazaro Arms, MD;  Location: [facility];  Service: Gynecology;  Laterality: Bilateral;  . TONSILLECTOMY AND ADENOIDECTOMY Bilateral 2003   Performed at [facility]    POb/GynH:      OB History    Gravida  2   Para  2   Term  2   Preterm      AB      Living  2     SAB      IAB      Ectopic      Multiple  0   Live Births  2           SH:   Social History   Tobacco Use  . Smoking status: Never Smoker  . Smokeless tobacco: Never Used  Vaping Use  . Vaping Use: Every day  Substance Use Topics  . Alcohol use: No  . Drug use: No    FH:    Family History  Problem Relation Age of Onset  . Depression Father   . Stroke Father   . Hypertension Father   . Heart disease Father   . Heart attack Maternal Grandfather   . Congestive Heart Failure Paternal Grandmother   . Migraines Paternal Grandmother   . COPD Paternal Grandmother   . Lung cancer Paternal Grandfather   . Autism Cousin        2 paternal first cousins have autism  . ADD / ADHD Cousin        Several paternal first cousins have ADHD/ADD     Allergies:  Allergies  Allergen Reactions  . Other Hives and Rash    Glitter make-up causes rash and hives      Medications:       Current Facility-Administered Medications:  .  ceFAZolin (ANCEF) 2-4 GM/100ML-% IVPB, , , ,  .  ceFAZolin (ANCEF) IVPB 2g/100 mL premix, 2 g, Intravenous, On Call to OR, Lazaro Arms, MD .  chlorhexidine (PERIDEX) 0.12 % solution 15 mL, 15 mL, Mouth/Throat, Once **OR** MEDLINE mouth rinse, 15 mL, Mouth Rinse, Once, Kiel, Mosetta Putt, MD .  ketorolac (TORADOL) 30 MG/ML injection 30 mg, 30 mg, Intravenous, Once, Lazaro Arms, MD .  ketorolac (TORADOL) 30 MG/ML injection, , , ,  .  lactated ringers infusion, , Intravenous, Continuous, Kiel, Mosetta Putt, MD .  povidone-iodine 10 % swab 2 application, 2 application, Topical, Once, Lazaro Arms, MD  Review of Systems:   Review of Systems  Constitutional: Negative for fever, chills, weight loss, malaise/fatigue and diaphoresis.  HENT: Negative for hearing loss, ear pain, nosebleeds, congestion, sore throat, neck pain, tinnitus and ear discharge.   Eyes: Negative for blurred vision, double vision, photophobia, pain, discharge and redness.  Respiratory: Negative for cough, hemoptysis, sputum production, shortness of breath,  wheezing and stridor.   Cardiovascular: Negative for chest pain, palpitations, orthopnea, claudication, leg swelling and PND.  Gastrointestinal: Positive for abdominal pain. Negative for heartburn, nausea, vomiting, diarrhea, constipation, blood in stool and melena.  Genitourinary: Negative for dysuria, urgency, frequency, hematuria and flank pain.  Musculoskeletal: Negative for myalgias, back pain, joint pain and falls.  Skin: Negative for itching and rash.  Neurological: Negative for dizziness, tingling, tremors, sensory change, speech change, focal weakness, seizures, loss of consciousness, weakness and headaches.  Endo/Heme/Allergies: Negative for environmental allergies and polydipsia. Does not bruise/bleed easily.  Psychiatric/Behavioral: Negative for depression, suicidal ideas, hallucinations,  memory loss and substance abuse. The patient is not nervous/anxious and does not have insomnia.      PHYSICAL EXAM:  Temperature 98.3 F (36.8 C), temperature source Oral, resp. rate 20, last menstrual period 02/06/2021, SpO2 100 %, not currently breastfeeding.    Vitals reviewed. Constitutional: She is oriented to person, place, and time. She appears well-developed and well-nourished.  HENT:  Head: Normocephalic and atraumatic.  Right Ear: External ear normal.  Left Ear: External ear normal.  Nose: Nose normal.  Mouth/Throat: Oropharynx is clear and moist.  Eyes: Conjunctivae and EOM are normal. Pupils are equal, round, and reactive to light. Right eye exhibits no discharge. Left eye exhibits no discharge. No scleral icterus.  Neck: Normal range of motion. Neck supple. No tracheal deviation present. No thyromegaly present.  Cardiovascular: Normal rate, regular rhythm, normal heart sounds and intact distal pulses.  Exam reveals no gallop and no friction rub.   No murmur heard. Respiratory: Effort normal and breath sounds normal. No respiratory distress. She has no wheezes. She has no rales. She exhibits no tenderness.  GI: Soft. Bowel sounds are normal. She exhibits no distension and no mass. There is tenderness. There is no rebound and no guarding.  Genitourinary:       Vulva is normal without lesions Vagina is pink moist without discharge Cervix normal in appearance and pap is normal Uterus is normal size, contour, position, consistency, mobility, non-tender Adnexa is negative with normal sized ovaries by sonogram  Musculoskeletal: Normal range of motion. She exhibits no edema and no tenderness.  Neurological: She is alert and oriented to person, place, and time. She has normal reflexes. She displays normal reflexes. No cranial nerve deficit. She exhibits normal muscle tone. Coordination normal.  Skin: Skin is warm and dry. No rash noted. No erythema. No pallor.  Psychiatric: She  has a normal mood and affect. Her behavior is normal. Judgment and thought content normal.    Labs: Results for orders placed or performed during the hospital encounter of 02/06/21 (from the past 336 hour(s))  SARS CORONAVIRUS 2 (TAT 6-24 HRS)   Collection Time: 02/06/21 10:09 PM  Result Value Ref Range   SARS Coronavirus 2 NEGATIVE NEGATIVE  Results for orders placed or performed during the hospital encounter of 02/06/21 (from the past 336 hour(s))  Urinalysis, Routine w reflex microscopic Urine, Clean Catch   Collection Time: 02/06/21  2:27 PM  Result Value Ref Range   Color, Urine AMBER (A) YELLOW   APPearance CLOUDY (A) CLEAR   Specific Gravity, Urine 1.026 1.005 - 1.030   pH 6.0 5.0 - 8.0   Glucose, UA 50 (A) NEGATIVE mg/dL   Hgb urine dipstick LARGE (A) NEGATIVE   Bilirubin Urine NEGATIVE NEGATIVE   Ketones, ur 5 (A) NEGATIVE mg/dL   Protein, ur 833 (A) NEGATIVE mg/dL   Nitrite NEGATIVE NEGATIVE   Leukocytes,Ua NEGATIVE NEGATIVE  RBC / HPF >50 (H) 0 - 5 RBC/hpf   WBC, UA 0-5 0 - 5 WBC/hpf   Bacteria, UA RARE (A) NONE SEEN   Squamous Epithelial / LPF 11-20 0 - 5   Mucus PRESENT   CBC   Collection Time: 02/06/21  2:30 PM  Result Value Ref Range   WBC 7.2 4.0 - 10.5 K/uL   RBC 4.85 3.87 - 5.11 MIL/uL   Hemoglobin 14.4 12.0 - 15.0 g/dL   HCT 90.2 40.9 - 73.5 %   MCV 90.5 80.0 - 100.0 fL   MCH 29.7 26.0 - 34.0 pg   MCHC 32.8 30.0 - 36.0 g/dL   RDW 32.9 92.4 - 26.8 %   Platelets 283 150 - 400 K/uL   nRBC 0.0 0.0 - 0.2 %  Comprehensive metabolic panel   Collection Time: 02/06/21  2:30 PM  Result Value Ref Range   Sodium 140 135 - 145 mmol/L   Potassium 3.6 3.5 - 5.1 mmol/L   Chloride 106 98 - 111 mmol/L   CO2 25 22 - 32 mmol/L   Glucose, Bld 89 70 - 99 mg/dL   BUN 10 6 - 20 mg/dL   Creatinine, Ser 3.41 0.44 - 1.00 mg/dL   Calcium 9.3 8.9 - 96.2 mg/dL   Total Protein 7.3 6.5 - 8.1 g/dL   Albumin 4.4 3.5 - 5.0 g/dL   AST 23 15 - 41 U/L   ALT 41 0 - 44 U/L    Alkaline Phosphatase 59 38 - 126 U/L   Total Bilirubin 0.6 0.3 - 1.2 mg/dL   GFR, Estimated >22 >97 mL/min   Anion gap 9 5 - 15  hCG, quantitative, pregnancy   Collection Time: 02/06/21  2:30 PM  Result Value Ref Range   hCG, Beta Chain, Quant, S <1 <5 mIU/mL  Rapid HIV screen (HIV 1/2 Ab+Ag)   Collection Time: 02/06/21  2:30 PM  Result Value Ref Range   HIV-1 P24 Antigen - HIV24 NON REACTIVE NON REACTIVE   HIV 1/2 Antibodies NON REACTIVE NON REACTIVE   Interpretation (HIV Ag Ab)      A non reactive test result means that HIV 1 or HIV 2 antibodies and HIV 1 p24 antigen were not detected in the specimen.    EKG: Orders placed or performed during the hospital encounter of 05/04/18  . ED EKG  . EKG 12-Lead  . EKG 12-Lead  . ED EKG  . EKG    Imaging Studies:   GYNECOLOGIC SONOGRAM   MARCINA KINNISON is a 25 y.o. [id number] LMP 05/20/2020,She is here for a pelvic sonogram for menorrhagia. She is s/p tubal cautery  Uterus                      7 x 4 x 5.2 cm, Total uterine volume 77 cc,homogeneous anteverted uterus,wnl  Endometrium          2.2 mm, symmetrical, wnl  Right ovary             3.2 x 2.8 x 2 cm, wnl  Left ovary                2.7 x 2.1 x 2.2 cm, wnl  No free fluid   Technician Comments:  PELVIC US TA/TV:homogeneous anteverted uterus,wnl,EEC 2.2 mm,normal ovaries,ovaries appear mobile,no pain during ultrasound  Chaperone [phone] Flora Lipps 06/30/2020 4:59 PM  Clinical Impression and recommendations:  I have reviewed the sonogram results above  to evaluate for heavy periods. She is s/p tubal cautery Combined with the patient's current clinical course, below are my impressions and any appropriate recommendations for management based on the sonographic findings:   FINDINGS:  I                            Uterus/ anteverted normal size, thin endometrium                              Adnexae: V                         Ovaries are  within  normal limits.                                                                       Right ovary normal                                                                        Left ovary    normal                                                                       Free Fluid  N/a                       IMPRESSION: .                                       Normal pelvic u/s  Tilda BurrowJohn V Ferguson 07/03/2020       Assessment: Menorrhagia Dysmenorrhea  Plan: Hysteroscopy uterine curettage Minerva endometrial ablation  Lazaro ArmsLuther H Stelios Kirby 02/08/2021 10:16 AM

## 2021-02-08 NOTE — Op Note (Signed)
Preoperative diagnosis:  1.   menorrhagia                                         2.  dysmenorrhea   Postoperative diagnoses: Same as above   Procedure: Hysteroscopy, uterine curettage, endometrial ablation using Minerva  Surgeon: Lazaro Arms   Anesthesia: Laryngeal mask airway  Findings: The endometrium was normal. There were no fibroid or other abnormalities.  Description of operation: The patient was taken to the operating room and placed in the supine position. She underwent general anesthesia using the laryngeal mask airway. She was placed in the dorsal lithotomy position and prepped and draped in the usual sterile fashion. A Graves speculum was placed and the anterior cervical lip was grasped with a single-tooth tenaculum. The cervix was dilated serially to allow passage of the hysteroscope. Diagnostic hysteroscopy was performed and was found to be normal. A vigorous uterine curettage was then performed and all tissue sent to pathology for evaluation.  I then proceeded to perform the Minerva endometrial ablation.   The uterus sounded to 8 cm The handpiece was attached to the Minerva power source/machine and the handpiece passed the checklist. The array was squeezed down to remove all of the air present.  The array was then place into the endometrial cavity and deployed to a length of 5 cm. The handpiece confirmed appropriate width by being in the green portion of the visual dial. The cervical cuff was then inflated to the point the CO2 indicator was in the green. The endometrial integrity check was then performed and integrity sequence was confirmed x 2. The heating was then begun and carried out for a total of 2 minutes(which is standard therapy time). When the plasma cycle was finished,  the cervical cuff was deflated and the array was removed with tissue present on the silicon membrane. There was appropriate post Minerva bleeding and uterine discharge.     All of the equipment  worked well throughout the procedure.  The patient was awakened from anesthesia and taken to the recovery room in good stable condition all counts were correct. She received 2 g of Ancef and 30 mg of Toradol preoperatively. She will be discharged from the recovery room and followed up in the office in 1- 2 weeks.   She can expect 4 weeks of post procedure bloody watery discharge  Lazaro Arms, MD  02/08/2021 11:05 AM

## 2021-02-08 NOTE — Discharge Instructions (Signed)
Endometrial Ablation Endometrial ablation is a procedure that destroys the thin inner layer of the lining of the uterus (endometrium). This procedure may be done:  To stop heavy menstrual periods.  To stop bleeding that is causing anemia.  To control irregular bleeding.  To treat bleeding caused by small tumors (fibroids) in the endometrium. This procedure is often done as an alternative to major surgery, such as removal of the uterus and cervix (hysterectomy). As a result of this procedure:  You may not be able to have children. However, if you have not yet gone through menopause: ? You may still have a small chance of getting pregnant. ? You will need to use a reliable method of birth control after the procedure to prevent pregnancy.  You may stop having a menstrual period, or you may have only a small amount of bleeding during your period. Menstruation may return several years after the procedure. Tell a health care provider about:  Any allergies you have.  All medicines you are taking, including vitamins, herbs, eye drops, creams, and over-the-counter medicines.  Any problems you or family members have had with the use of anesthetic medicines.  Any blood disorders you have.  Any surgeries you have had.  Any medical conditions you have.  Whether you are pregnant or may be pregnant. What are the risks? Generally, this is a safe procedure. However, problems may occur, including:  A hole (perforation) in the uterus or bowel.  Infection in the uterus, bladder, or vagina.  Bleeding.  Allergic reaction to medicines.  Damage to nearby structures or organs.  An air bubble in the lung (air embolus).  Problems with pregnancy.  Failure of the procedure.  Decreased ability to diagnose cancer in the endometrium. Scar tissue forms after the procedure, making it more difficult to get a sample of the uterine lining. What happens before the procedure? Medicines Ask your health  care provider about:  Changing or stopping your regular medicines. This is especially important if you take diabetes medicines or blood thinners.  Taking medicines such as aspirin and ibuprofen. These medicines can thin your blood. Do not take these medicines before your procedure if your doctor tells you not to take them.  Taking over-the-counter medicines, vitamins, herbs, and supplements. Tests  You will have tests of your endometrium to make sure there are no precancerous cells or cancer cells present.  You may have an ultrasound of the uterus. General instructions  Do not use any products that contain nicotine or tobacco for at least 4 weeks before the procedure. These include cigarettes, chewing tobacco, and vaping devices, such as e-cigarettes. If you need help quitting, ask your health care provider.  You may be given medicines to thin the endometrium.  Ask your health care provider what steps will be taken to help prevent infection. These steps may include: ? Removing hair at the surgery site. ? Washing skin with a germ-killing soap. ? Taking antibiotic medicine.  Plan to have a responsible adult take you home from the hospital or clinic.  Plan to have a responsible adult care for you for the time you are told after you leave the hospital or clinic. This is important. What happens during the procedure?  You will lie on an exam table with your feet and legs supported as in a pelvic exam.  An IV will be inserted into one of your veins.  You will be given a medicine to help you relax (sedative).  A surgical tool with   a light and camera (resectoscope) will be inserted into your vagina and moved into your uterus. This allows your surgeon to see inside your uterus.  Endometrial tissue will be destroyed and removed, using one of the following methods: ? Radiofrequency. This uses an electrical current to destroy the endometrium. ? Cryotherapy. This uses extreme cold to freeze  the endometrium. ? Heated fluid. This uses a heated salt and water (saline) solution to destroy the endometrium. ? Microwave. This uses high-energy microwaves to heat up the endometrium and destroy it. ? Thermal balloon. This involves inserting a catheter with a balloon tip into the uterus. The balloon tip is filled with heated fluid to destroy the endometrium. The procedure may vary among health care providers and hospitals.   What happens after the procedure?  Your blood pressure, heart rate, breathing rate, and blood oxygen level will be monitored until you leave the hospital or clinic.  You may have vaginal bleeding for 4-6 weeks after the procedure. You may also have: ? Cramps. ? A thin, watery vaginal discharge that is light pink or brown. ? A need to urinate more than usual. ? Nausea.  If you were given a sedative during the procedure, it can affect you for several hours. Do not drive or operate machinery until your health care provider says that it is safe.  Do not have sex or insert anything into your vagina until your health care provider says it is safe. Summary  Endometrial ablation is done to treat many causes of heavy menstrual bleeding. The procedure destroys the thin inner layer of the lining of the uterus (endometrium).  This procedure is often done as an alternative to major surgery, such as removal of the uterus and cervix (hysterectomy).  Plan to have a responsible adult take you home from the hospital or clinic. This information is not intended to replace advice given to you by your health care provider. Make sure you discuss any questions you have with your health care provider. Document Revised: 05/12/2020 Document Reviewed: 05/12/2020 Elsevier Patient Education  2021 Elsevier Inc.    General Anesthesia, Adult, Care After This sheet gives you information about how to care for yourself after your procedure. Your health care provider may also give you more  specific instructions. If you have problems or questions, contact your health care provider. What can I expect after the procedure? After the procedure, the following side effects are common:  Pain or discomfort at the IV site.  Nausea.  Vomiting.  Sore throat.  Trouble concentrating.  Feeling cold or chills.  Feeling weak or tired.  Sleepiness and fatigue.  Soreness and body aches. These side effects can affect parts of the body that were not involved in surgery. Follow these instructions at home: For the time period you were told by your health care provider:  Rest.  Do not participate in activities where you could fall or become injured.  Do not drive or use machinery.  Do not drink alcohol.  Do not take sleeping pills or medicines that cause drowsiness.  Do not make important decisions or sign legal documents.  Do not take care of children on your own.   Eating and drinking  Follow any instructions from your health care provider about eating or drinking restrictions.  When you feel hungry, start by eating small amounts of foods that are soft and easy to digest (bland), such as toast. Gradually return to your regular diet.  Drink enough fluid to keep your urine  pale yellow.  If you vomit, rehydrate by drinking water, juice, or clear broth. General instructions  If you have sleep apnea, surgery and certain medicines can increase your risk for breathing problems. Follow instructions from your health care provider about wearing your sleep device: ? Anytime you are sleeping, including during daytime naps. ? While taking prescription pain medicines, sleeping medicines, or medicines that make you drowsy.  Have a responsible adult stay with you for the time you are told. It is important to have someone help care for you until you are awake and alert.  Return to your normal activities as told by your health care provider. Ask your health care provider what activities  are safe for you.  Take over-the-counter and prescription medicines only as told by your health care provider.  If you smoke, do not smoke without supervision.  Keep all follow-up visits as told by your health care provider. This is important. Contact a health care provider if:  You have nausea or vomiting that does not get better with medicine.  You cannot eat or drink without vomiting.  You have pain that does not get better with medicine.  You are unable to pass urine.  You develop a skin rash.  You have a fever.  You have redness around your IV site that gets worse. Get help right away if:  You have difficulty breathing.  You have chest pain.  You have blood in your urine or stool, or you vomit blood. Summary  After the procedure, it is common to have a sore throat or nausea. It is also common to feel tired.  Have a responsible adult stay with you for the time you are told. It is important to have someone help care for you until you are awake and alert.  When you feel hungry, start by eating small amounts of foods that are soft and easy to digest (bland), such as toast. Gradually return to your regular diet.  Drink enough fluid to keep your urine pale yellow.  Return to your normal activities as told by your health care provider. Ask your health care provider what activities are safe for you. This information is not intended to replace advice given to you by your health care provider. Make sure you discuss any questions you have with your health care provider. Document Revised: 07/07/2020 Document Reviewed: 02/04/2020 Elsevier Patient Education  2021 Elsevier Inc.   Ketorolac Oral Tablets What is this medicine? KETOROLAC (kee toe ROLE ak) is a non-steroidal anti-inflammatory drug, also known as an NSAID. It treats pain, inflammation, and swelling. This medicine may be used for other purposes; ask your health care provider or pharmacist if you have  questions. COMMON BRAND NAME(S): Toradol What should I tell my health care provider before I take this medicine? They need to know if you have any of these conditions:  bleeding disorder  coronary artery bypass graft (CABG) within the past 2 weeks  heart attack  heart disease  heart failure  high blood pressure  if you often drink alcohol  kidney disease  liver disease  lung or breathing disease (asthma)  receiving steroids like dexamethasone or prednisone  smoke tobacco cigarettes  stomach bleeding  stomach ulcers, other stomach or intestine problems  take medicine to treat or prevent blood clots  an unusual or allergic reaction to ketorolac, other medicines, foods, dyes, or preservatives  pregnant or trying to get pregnant  breast-feeding How should I use this medicine? Take this medicine by  mouth. Take it as directed on the prescription label at the same time every day. You can take it with or without food. If it upsets your stomach, take it with food. There may be unused or extra doses in the bottle after you finish the dosing cycle. Talk to your health care provider if you have questions about your dose. A special MedGuide will be given to you by the pharmacist with each prescription and refill. Be sure to read this information carefully each time. Talk to your health care provider about the use of this medicine in children. Special care may be needed. Patients over 57 years of age may have a stronger reaction and need a smaller dose. Overdosage: If you think you have taken too much of this medicine contact a poison control center or emergency room at once. NOTE: This medicine is only for you. Do not share this medicine with others. What if I miss a dose? If you miss a dose, skip it. Take your next dose at the normal time. Do not take extra or 2 doses at the same time to make up for the missed dose. What may interact with this medicine? Do not take this medicine  with any of the following medications:  aspirin and aspirin-like medicines  cidofovir  methotrexate  NSAIDs, medicines for pain and inflammation, like ibuprofen or naproxen  pemetrexed  probenecid This medicine may also interact with the following medications:  alcohol  alendronate  alprazolam  carbamazepine  cyclosporine  diuretics  flavocoxid  fluoxetine  ginkgo  lithium  medicines for high blood pressure like enalapril  medicines that affect platelets like pentoxifylline  medicines that treat or prevent blood clots like heparin, warfarin  muscle relaxants  phenytoin  steroid medicines like prednisone or cortisone  thiothixene This list may not describe all possible interactions. Give your health care provider a list of all the medicines, herbs, non-prescription drugs, or dietary supplements you use. Also tell them if you smoke, drink alcohol, or use illegal drugs. Some items may interact with your medicine. What should I watch for while using this medicine? Visit your health care provider for regular checks on your progress. Tell your health care provider if your symptoms do not start to get better or if they get worse. Do not use this medicine for more than 5 days. It is only used for short-term treatment of moderate to severe pain. The risk of side effects such as kidney damage and stomach bleeding are higher if used for more than 5 days. Do not take other medicines that contain aspirin, ibuprofen, or naproxen with this medicine. Side effects such as stomach upset, nausea, or ulcers may be more likely to occur. Many non-prescription medicines contain aspirin, ibuprofen, or naproxen. Always read labels carefully. This medicine can cause serious ulcers and bleeding in the stomach. It can happen with no warning. Smoking, drinking alcohol, older age, and poor health can also increase risks. Call your health care provider right away if you have stomach pain or  blood in your vomit or stool. This medicine may cause serious skin reactions. They can happen weeks to months after starting the medicine. Contact your health care provider right away if you notice fevers or flu-like symptoms with a rash. The rash may be red or purple and then turn into blisters or peeling of the skin. Or, you might notice a red rash with swelling of the face, lips or lymph nodes in your neck or under your arms. This  medicine does not prevent a heart attack or stroke. This medicine may increase the chance of a heart attack or stroke. The chance may increase the longer you use this medicine or if you have heart disease. If you take aspirin to prevent a heart attack or stroke, talk to your health care provider about using this medicine. You may get drowsy or dizzy. Do not drive, use machinery, or do anything that needs mental alertness until you know how this medicine affects you. Do not stand up or sit up quickly, especially if you are an older patient. This reduces the risk of dizzy or fainting spells. What side effects may I notice from receiving this medicine? Side effects that you should report to your doctor or health care professional as soon as possible:  allergic reactions (skin rash, itching or hives; swelling of the face, lips, or tongue)  bleeding (bloody or black, tarry stools; red or dark brown urine; spitting up blood or brown material that looks like coffee grounds; red spots on the skin; unusual bruising or bleeding from the eyes, gums, or nose)  heart attack (trouble breathing; pain or tightness in the chest, neck, back or arms; unusually weak or tired)  heart failure (trouble breathing; fast, irregular heartbeat; sudden weight gain; swelling of the ankles, feet, hands; unusually weak or tired)  kidney injury (trouble passing urine or change in the amount of urine)  liver injury (dark yellow or brown urine; general ill feeling or flu-like symptoms; loss of appetite,  right upper belly pain; unusually weak or tired, yellowing of the eyes or skin)  low red blood cell counts (trouble breathing; feeling faint; lightheaded, falls; unusually weak or tired)  rash, fever, and swollen lymph nodes  redness, blistering, peeling, or loosening of the skin, including inside the mouth  stroke (changes in vision; confusion; trouble speaking or understanding; severe headaches; sudden numbness or weakness of the face, arm or leg; trouble walking; dizziness; loss of balance or coordination) Side effects that usually do not require medical attention (report to your doctor or health care professional if they continue or are bothersome):  constipation  decreased hearing, ringing in the ears  diarrhea  headache  nausea  passing gas  stomach pain  upset stomach This list may not describe all possible side effects. Call your doctor for medical advice about side effects. You may report side effects to FDA at 1-800-FDA-1088. Where should I keep my medicine? Keep out of the reach of children and pets. Store at room temperature between 15 and 30 degrees C (59 and 86 degrees F). Protect from moisture. Keep the container tightly closed. Protect from light. Get rid of any unused medicine after the expiration date. To get rid of medicines that are no longer needed or expired:  Take the medicine to a medicine take-back program. Check with your pharmacy or law enforcement to find a location.  If you cannot return the medicine, check the label or package insert to see if the medicine should be thrown out in the garbage or flushed down the toilet. If you are not sure, ask your health care provider. If it is safe to put in the trash, pour the medicine out of the container. Mix the medicine with cat litter, dirt, coffee grounds, or other unwanted substance. Seal the mixture in a bag or container. Put it in the trash. NOTE: This sheet is a summary. It may not cover all possible  information. If you have questions about this medicine, talk  to your doctor, pharmacist, or health care provider.  2021 Elsevier/Gold Standard (2020-03-11 10:29:52)  Ondansetron tablets What is this medicine? ONDANSETRON (on DAN se tron) is used to treat nausea and vomiting caused by chemotherapy. It is also used to prevent or treat nausea and vomiting after surgery. This medicine may be used for other purposes; ask your health care provider or pharmacist if you have questions. COMMON BRAND NAME(S): Zofran What should I tell my health care provider before I take this medicine? They need to know if you have any of these conditions:  heart disease  history of irregular heartbeat  liver disease  low levels of magnesium or potassium in the blood  an unusual or allergic reaction to ondansetron, granisetron, other medicines, foods, dyes, or preservatives  pregnant or trying to get pregnant  breast-feeding How should I use this medicine? Take this medicine by mouth with a glass of water. Follow the directions on your prescription label. Take your doses at regular intervals. Do not take your medicine more often than directed. Talk to your pediatrician regarding the use of this medicine in children. Special care may be needed. Overdosage: If you think you have taken too much of this medicine contact a poison control center or emergency room at once. NOTE: This medicine is only for you. Do not share this medicine with others. What if I miss a dose? If you miss a dose, take it as soon as you can. If it is almost time for your next dose, take only that dose. Do not take double or extra doses. What may interact with this medicine? Do not take this medicine with any of the following medications:  apomorphine  certain medicines for fungal infections like fluconazole, itraconazole, ketoconazole, posaconazole, voriconazole  cisapride  dronedarone  pimozide  thioridazine This medicine may  also interact with the following medications:  carbamazepine  certain medicines for depression, anxiety, or psychotic disturbances  fentanyl  linezolid  MAOIs like Carbex, Eldepryl, Marplan, Nardil, and Parnate  methylene blue (injected into a vein)  other medicines that prolong the QT interval (cause an abnormal heart rhythm) like dofetilide, ziprasidone  phenytoin  rifampicin  tramadol This list may not describe all possible interactions. Give your health care provider a list of all the medicines, herbs, non-prescription drugs, or dietary supplements you use. Also tell them if you smoke, drink alcohol, or use illegal drugs. Some items may interact with your medicine. What should I watch for while using this medicine? Check with your doctor or health care professional right away if you have any sign of an allergic reaction. What side effects may I notice from receiving this medicine? Side effects that you should report to your doctor or health care professional as soon as possible:  allergic reactions like skin rash, itching or hives, swelling of the face, lips or tongue  breathing problems  confusion  dizziness  fast or irregular heartbeat  feeling faint or lightheaded, falls  fever and chills  loss of balance or coordination  seizures  sweating  swelling of the hands or feet  tightness in the chest  tremors  unusually weak or tired Side effects that usually do not require medical attention (report to your doctor or health care professional if they continue or are bothersome):  constipation or diarrhea  headache This list may not describe all possible side effects. Call your doctor for medical advice about side effects. You may report side effects to FDA at 1-800-FDA-1088. Where should I keep  my medicine? Keep out of the reach of children. Store between 2 and 30 degrees C (36 and 86 degrees F). Throw away any unused medicine after the expiration  date. NOTE: This sheet is a summary. It may not cover all possible information. If you have questions about this medicine, talk to your doctor, pharmacist, or health care provider.  2021 Elsevier/Gold Standard (2018-10-14 07:16:43)   Acetaminophen; Hydrocodone tablets or capsules What is this medicine? ACETAMINOPHEN; HYDROCODONE (a set a MEE noe fen; hye droe KOE done) is a pain reliever. It is used to treat moderate to severe pain. This medicine may be used for other purposes; ask your health care provider or pharmacist if you have questions. COMMON BRAND NAME(S): Anexsia, Bancap HC, Ceta-Plus, Co-Gesic, Comfortpak, Dolagesic, Du PontDolorex Forte, [address]/Fiscal ServicesDuoCet, [address], Hydrogesic, [place], Lorcet HD, Lorcet Plus, Lortab, Margesic H, Maxidone, [place], Polygesic, [place]ic, [place], Retail buyerVerdrocet, Vicodin, Vicodin ES, Vicodin HP, [place] BasemanXodol, Zydone What should I tell my health care provider before I take this medicine? They need to know if you have any of these conditions:  brain tumor  drug abuse or addiction  head injury  heart disease  if you often drink alcohol  kidney disease  liver disease  low adrenal gland function  lung disease, asthma, or breathing problems  seizures  stomach or intestine problems  taken an MAOI like Marplan, Nardil, or Parnate in the last 14 days  an unusual or allergic reaction to acetaminophen, hydrocodone, other medicines, foods, dyes, or preservatives  pregnant or trying to get pregnant  breast-feeding How should I use this medicine? Take this medicine by mouth with a glass of water. Follow the directions on the prescription label. You can take it with or without food. If it upsets your stomach, take it with food. Do not take your medicine more often than directed. A special MedGuide will be given to you by the pharmacist with each prescription and refill. Be sure to read this information carefully each time. Talk to your pediatrician regarding the use of this  medicine in children. Special care may be needed. Overdosage: If you think you have taken too much of this medicine contact a poison control center or emergency room at once. NOTE: This medicine is only for you. Do not share this medicine with others. What if I miss a dose? If you miss a dose, take it as soon as you can. If it is almost time for your next dose, take only that dose. Do not take double or extra doses. What may interact with this medicine? This medicine may interact with the following medications:  alcohol  antiviral medicines for HIV or AIDS  atropine  antihistamines for allergy, cough and cold  certain antibiotics like erythromycin, clarithromycin  certain medicines for anxiety or sleep  certain medicines for bladder problems like oxybutynin, tolterodine  certain medicines for depression like amitriptyline, fluoxetine, sertraline  certain medicines for fungal infections like ketoconazole and itraconazole  certain medicines for Parkinson's disease like benztropine, trihexyphenidyl  certain medicines for seizures like carbamazepine, phenobarbital, phenytoin, primidone  certain medicines for stomach problems like dicyclomine, hyoscyamine  certain medicines for travel sickness like scopolamine  general anesthetics like halothane, isoflurane, methoxyflurane, propofol  ipratropium  local anesthetics like lidocaine, pramoxine, tetracaine  MAOIs like Carbex, Eldepryl, Marplan, Nardil, and Parnate  medicines that relax muscles for surgery  other medicines with acetaminophen  other narcotic medicines for pain or cough  phenothiazines like chlorpromazine, mesoridazine, prochlorperazine, thioridazine  rifampin This list may not describe all  possible interactions. Give your health care provider a list of all the medicines, herbs, non-prescription drugs, or dietary supplements you use. Also tell them if you smoke, drink alcohol, or use illegal drugs. Some items  may interact with your medicine. What should I watch for while using this medicine? Tell your health care provider if your pain does not go away, if it gets worse, or if you have new or a different type of pain. You may develop tolerance to this drug. Tolerance means that you will need a higher dose of the drug for pain relief. Tolerance is normal and is expected if you take this drug for a long time. There are different types of narcotic drugs (opioids) for pain. If you take more than one type at the same time, you may have more side effects. Give your health care provider a list of all drugs you use. He or she will tell you how much drug to take. Do not take more drug than directed. Get emergency help right away if you have problems breathing. Do not suddenly stop taking your drug because you may develop a severe reaction. Your body becomes used to the drug. This does NOT mean you are addicted. Addiction is a behavior related to getting and using a drug for a nonmedical reason. If you have pain, you have a medical reason to take pain drug. Your health care provider will tell you how much drug to take. If your health care provider wants you to stop the drug, the dose will be slowly lowered over time to avoid any side effects. Talk to your health care provider about naloxone and how to get it. Naloxone is an emergency drug used for an opioid overdose. An overdose can happen if you take too much opioid. It can also happen if an opioid is taken with some other drugs or substances, like alcohol. Know the symptoms of an overdose, like trouble breathing, unusually tired or sleepy, or not being able to respond or wake up. Make sure to tell caregivers and close contacts where it is stored. Make sure they know how to use it. After naloxone is given, you must get emergency help right away. Naloxone is a temporary treatment. Repeat doses may be needed. Do not take other drugs that contain acetaminophen with this drug.  Many non-prescription drugs contain acetaminophen. Always read labels carefully. If you have questions, ask your health care provider. If you take too much acetaminophen, get medical help right away. Too much acetaminophen can be very dangerous and cause liver damage. Even if you do not have symptoms, it is important to get help right away. You may get drowsy or dizzy. Do not drive, use machinery, or do anything that needs mental alertness until you know how this drug affects you. Do not stand up or sit up quickly, especially if you are an older patient. This reduces the risk of dizzy or fainting spells. Alcohol may interfere with the effect of this drug. Avoid alcoholic drinks. This drug will cause constipation. If you do not have a bowel movement for 3 days, call your health care provider. Your mouth may get dry. Chewing sugarless gum or sucking hard candy and drinking plenty of water may help. Contact your health care provider if the problem does not go away or is severe. What side effects may I notice from receiving this medicine? Side effects that you should report to your doctor or health care professional as soon as possible:  allergic reactions  like skin rash, itching or hives, swelling of the face, lips, or tongue  breathing problems  confusion  redness, blistering, peeling or loosening of the skin, including inside the mouth  signs and symptoms of low blood pressure like dizziness; feeling faint or lightheaded, falls; unusually weak or tired  trouble passing urine or change in the amount of urine  yellowing of the eyes or skin Side effects that usually do not require medical attention (report to your doctor or health care professional if they continue or are bothersome):  constipation  dry mouth  nausea, vomiting  tiredness This list may not describe all possible side effects. Call your doctor for medical advice about side effects. You may report side effects to FDA at  1-800-FDA-1088. Where should I keep my medicine? Keep out of the reach of children. This medicine can be abused. Keep your medicine in a safe place to protect it from theft. Do not share this medicine with anyone. Selling or giving away this medicine is dangerous and against the law. Store at room temperature between 15 and 30 degrees C (59 and 86 degrees F). This medicine may cause harm and death if it is taken by other adults, children, or pets. Return medicine that has not been used to an official disposal site. Contact the DEA at 787-848-7373 or your city/county government to find a site. If you cannot return the medicine, flush it down the toilet. Do not use the medicine after the expiration date. NOTE: This sheet is a summary. It may not cover all possible information. If you have questions about this medicine, talk to your doctor, pharmacist, or health care provider.  2021 Elsevier/Gold Standard (2019-06-03 12:25:54)

## 2021-02-08 NOTE — Transfer of Care (Signed)
Immediate Anesthesia Transfer of Care Note  Patient: Jamie Maynard  Procedure(s) Performed: DILATATION AND CURETTAGE/HYSTEROSCOPY WITH MINERVA (N/A Uterus)  Patient Location: PACU  Anesthesia Type:General  Level of Consciousness: awake  Airway & Oxygen Therapy: Patient Spontanous Breathing  Post-op Assessment: Report given to RN and Post -op Vital signs reviewed and stable  Post vital signs: Reviewed and stable  Last Vitals:  Vitals Value Taken Time  BP 114/76 02/08/21 1130  Temp    Pulse 77 02/08/21 1133  Resp 12 02/08/21 1133  SpO2 100 % 02/08/21 1133  Vitals shown include unvalidated device data.  Last Pain:  Vitals:   02/08/21 1007  TempSrc: Oral  PainSc: 2       Patients Stated Pain Goal: 7 (02/08/21 1007)  Complications: No complications documented.

## 2021-02-09 ENCOUNTER — Encounter (HOSPITAL_COMMUNITY): Payer: Self-pay | Admitting: Obstetrics & Gynecology

## 2021-02-09 LAB — SURGICAL PATHOLOGY

## 2021-02-20 ENCOUNTER — Encounter: Payer: Medicaid Other | Admitting: Obstetrics & Gynecology

## 2021-11-15 IMAGING — DX DG LUMBAR SPINE COMPLETE 4+V
5 series · 5 of 5 positions shown · non-contrast
Comparison: None.

CLINICAL DATA: Low back pain after fall today down stairs.

EXAM:
LUMBAR SPINE - COMPLETE 4+ VIEW

[lumbar spine ap]
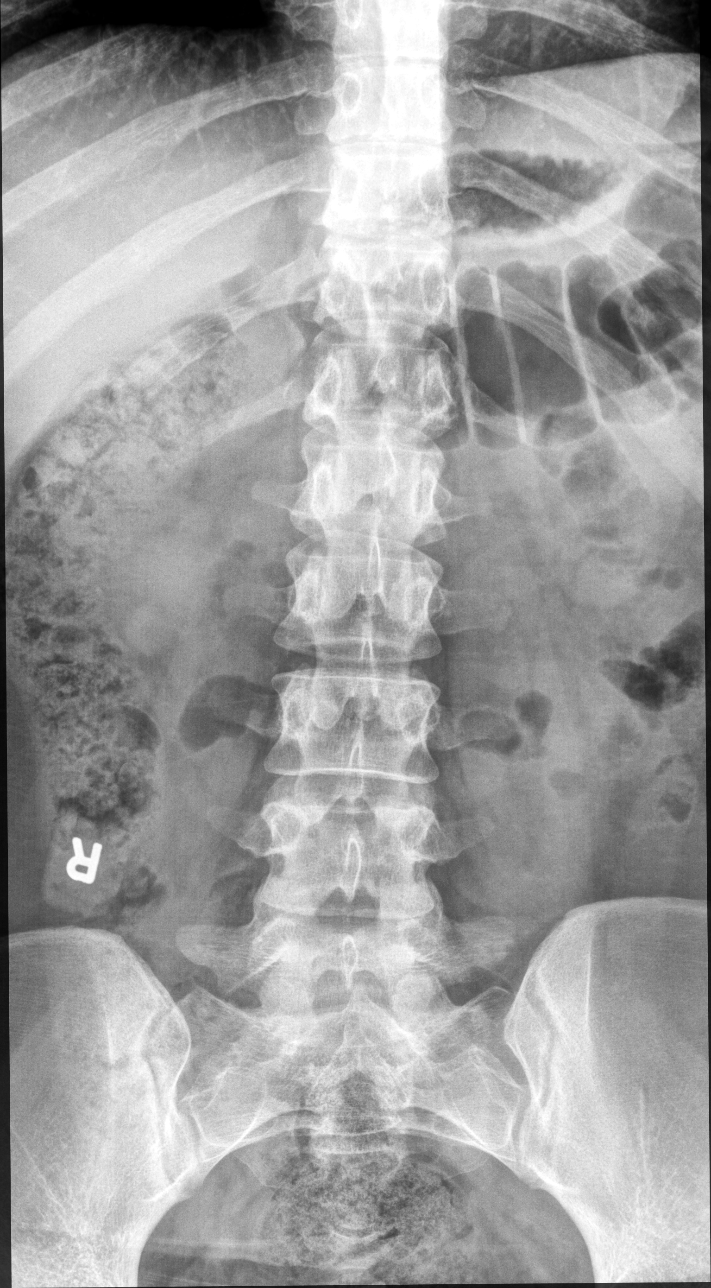

[lumbar spine mlo (1 of 2)]
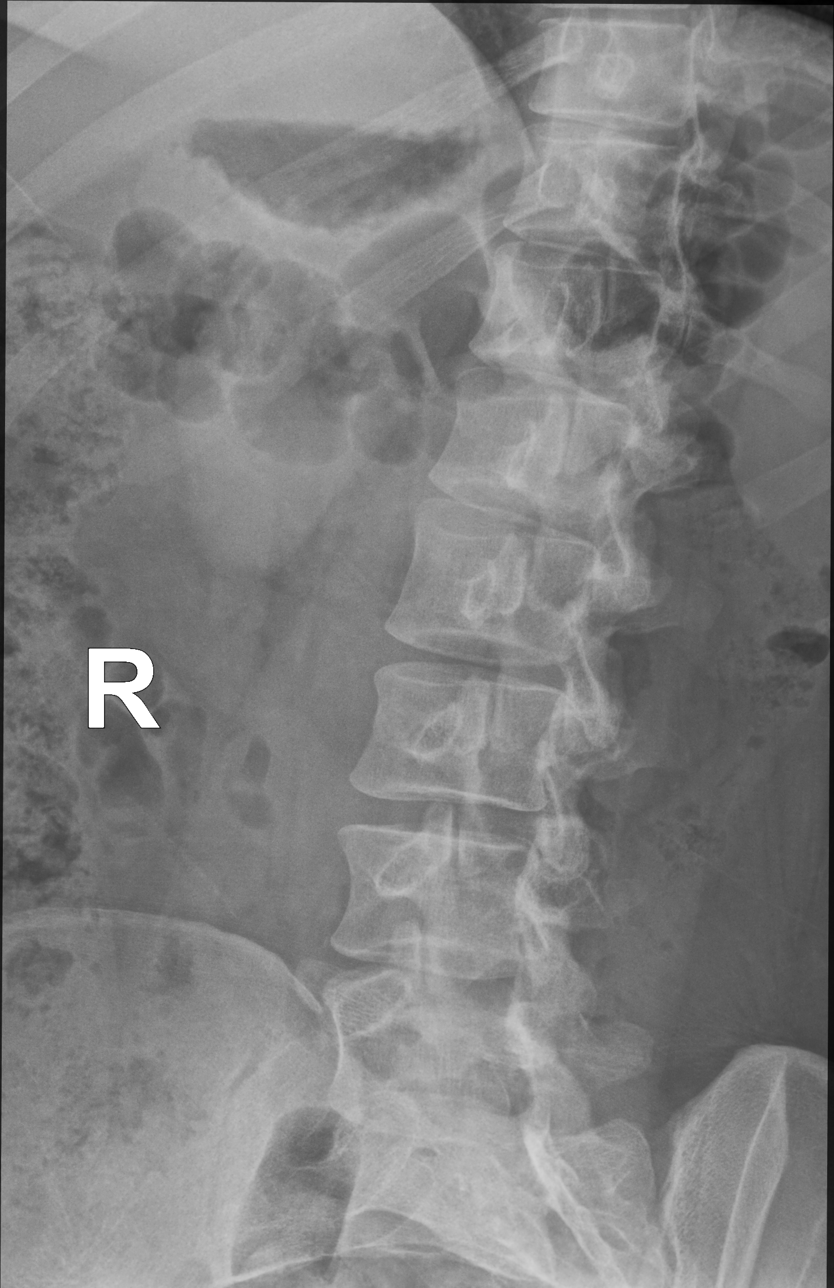

[lumbar spine mlo (2 of 2)]
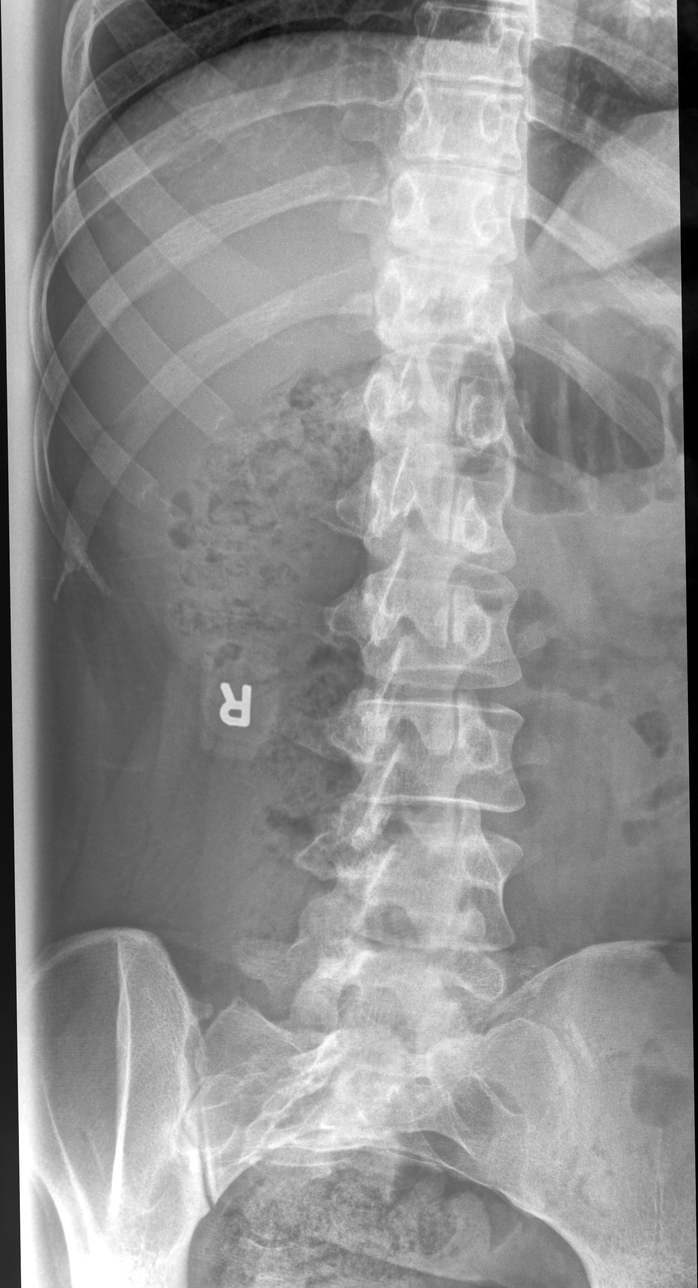

[lumbar spine lat (1 of 2)]
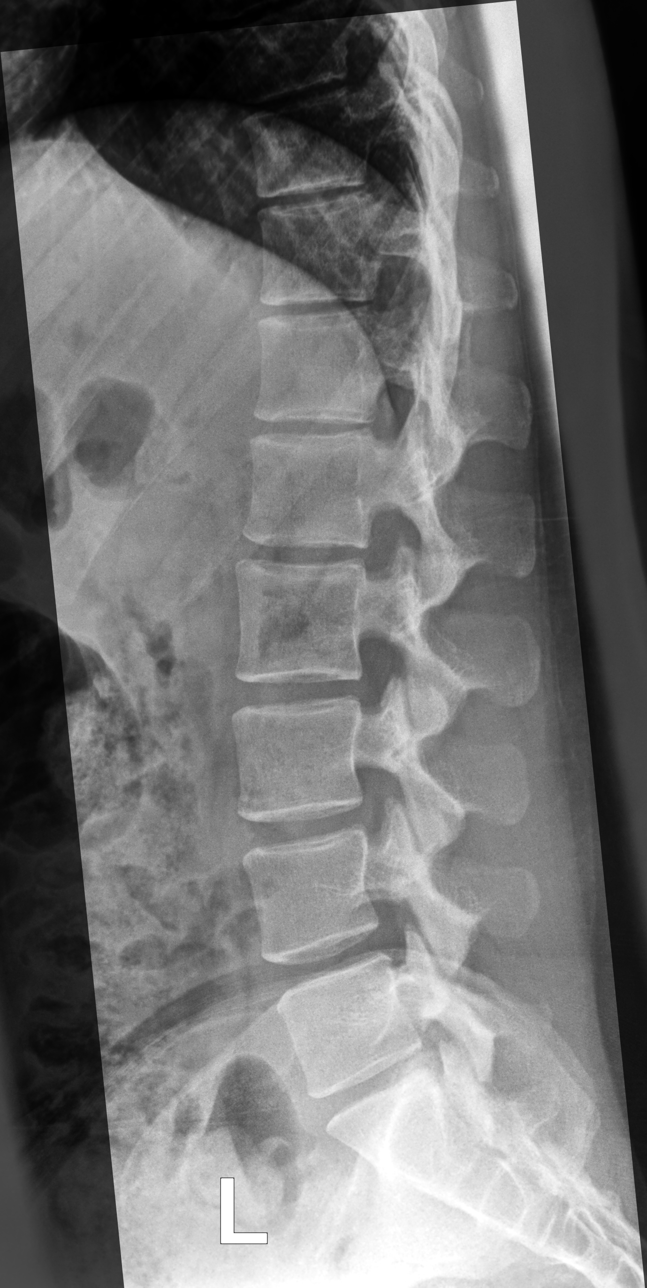

[lumbar spine lat (2 of 2)]
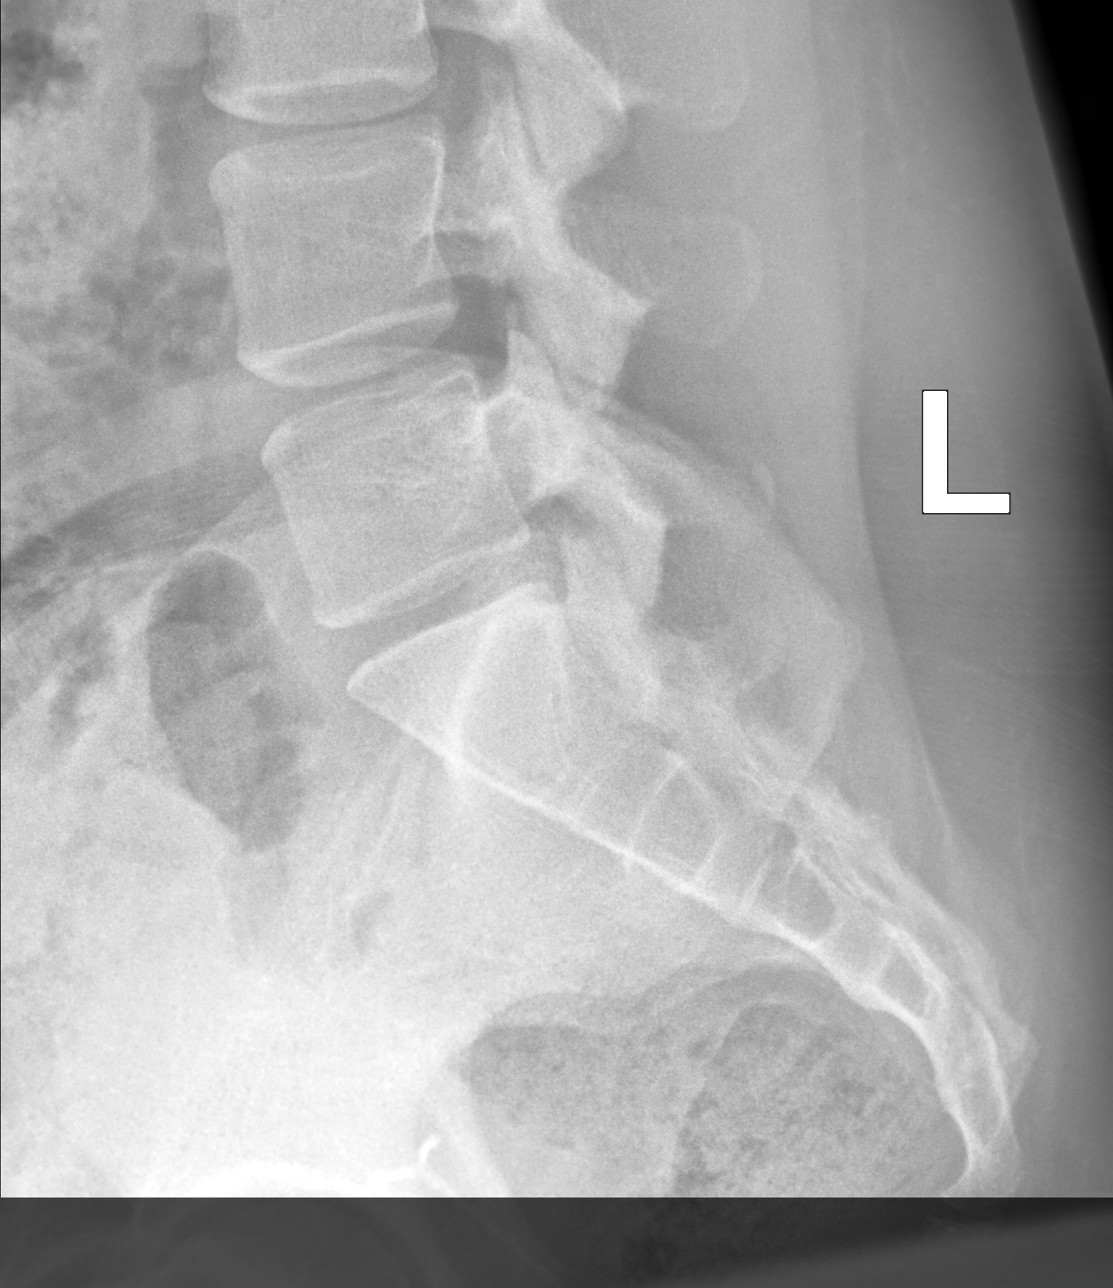

[5 of 5 positions shown; findings below may reference images not displayed]

FINDINGS: There is no evidence of lumbar spine fracture. Alignment is normal.
Intervertebral disc spaces are maintained.
IMPRESSION: Negative.

## 2022-05-10 ENCOUNTER — Encounter (INDEPENDENT_AMBULATORY_CARE_PROVIDER_SITE_OTHER): Payer: Self-pay | Admitting: *Deleted

## 2022-07-10 ENCOUNTER — Encounter (INDEPENDENT_AMBULATORY_CARE_PROVIDER_SITE_OTHER): Payer: Self-pay

## 2022-07-10 ENCOUNTER — Ambulatory Visit (INDEPENDENT_AMBULATORY_CARE_PROVIDER_SITE_OTHER): Payer: Medicaid Other | Admitting: Gastroenterology

## 2022-07-10 ENCOUNTER — Other Ambulatory Visit (INDEPENDENT_AMBULATORY_CARE_PROVIDER_SITE_OTHER): Payer: Self-pay

## 2022-07-10 ENCOUNTER — Encounter (INDEPENDENT_AMBULATORY_CARE_PROVIDER_SITE_OTHER): Payer: Self-pay | Admitting: Gastroenterology

## 2022-07-10 VITALS — BP 107/69 | HR 96 | Temp 97.6°F | Ht 67.0 in | Wt 166.7 lb

## 2022-07-10 DIAGNOSIS — Z01812 Encounter for preprocedural laboratory examination: Secondary | ICD-10-CM

## 2022-07-10 DIAGNOSIS — R112 Nausea with vomiting, unspecified: Secondary | ICD-10-CM

## 2022-07-10 NOTE — Progress Notes (Signed)
Referring Provider: Richardean Chimera, MD Primary Care Physician:  Richardean Chimera, MD Primary GI Physician: new  Chief Complaint  Patient presents with   Nausea    Patient here today due to bouts of nausea and vomiting that occurs off and on every few weeks.   HPI:   Jamie Maynard is a 26 y.o. female with no significant past medical history.   Patient presenting today as a new patient for nausea and vomiting.   H pylori serology negative in June 2023.   Labs 07/01/22 AST 24, ALT 27, AP 57, T bili 0.8, lipase normal at 48, WBC 11.5, hgb 14.5, UDS + for cannabinoids  Patient reports for the past year she has been having episodes every few weeks with nausea, vomiting and abdominal pain. She reports mid abdominal pain when this occurs as well as some diarrhea. She also endorses acid reflux symptoms after she has nausea and vomiting. She denies GERD symptoms at other times. She endorses black stools at the time of her acute illness episodes. Denies rectal bleeding. No significant weight loss other than during episodes of nausea and vomiting. Recent ED visit at [facility] on 07/01/22 with no significant findings. She reports multiple ED visits for the same. She denies alcohol use. She does endorse marijuana use, maybe 1-2x/week. She does note that sometimes when she has nausea and vomiting she will smoke MJ which will help with her symptoms.   NSAID use: none  Social hx:no etoh,  Fam hx:no crc, liver disease or pancreatic cancer  CT A/P with contrast: 07/01/22 Trace nonspecific free pelvic fluid.  No acute intra-abdominal or intrapelvic abnormalities otherwise  seen.  Last Colonoscopy:never  Last Endoscopy:never  Recommendations:    Past Medical History:  Diagnosis Date   Medical history non-contributory     Past Surgical History:  Procedure Laterality Date   DILATATION AND CURETTAGE/HYSTEROSCOPY WITH MINERVA N/A 02/08/2021   Procedure: DILATATION AND CURETTAGE/HYSTEROSCOPY WITH MINERVA;   Surgeon: Lazaro Arms, MD;  Location: [facility];  Service: Gynecology;  Laterality: N/A;   IUD REMOVAL N/A 06/01/2020   Procedure: INTRAUTERINE DEVICE (IUD) REMOVAL;  Surgeon: Lazaro Arms, MD;  Location: [facility];  Service: Gynecology;  Laterality: N/A;   LAPAROSCOPIC TUBAL LIGATION Bilateral 06/01/2020   Procedure: LAPAROSCOPIC BILATERAL TUBAL LIGATION USING ELECTROCAUTERY;  Surgeon: Lazaro Arms, MD;  Location: [facility];  Service: Gynecology;  Laterality: Bilateral;   TONSILLECTOMY AND ADENOIDECTOMY Bilateral 2003   Performed at [facility]    Current Outpatient Medications  Medication Sig Dispense Refill   ondansetron (ZOFRAN ODT) 8 MG disintegrating tablet Take 1 tablet (8 mg total) by mouth every 8 (eight) hours as needed for nausea or vomiting. 8 tablet 0   No current facility-administered medications for this visit.    Allergies as of 07/10/2022 - Review Complete 07/10/2022  Allergen Reaction Noted   Other Hives and Rash 09/08/2015    Family History  Problem Relation Age of Onset   Depression Father    Stroke Father    Hypertension Father    Heart disease Father    Heart attack Maternal Grandfather    Congestive Heart Failure Paternal Grandmother    Migraines Paternal Grandmother    COPD Paternal Grandmother    Lung cancer Paternal Grandfather    Autism Cousin        2 paternal first cousins have autism   ADD / ADHD Cousin        Several paternal first cousins  have ADHD/ADD    Social History   Socioeconomic History   Marital status: Single    Spouse name: Not on file   Number of children: 2   Years of education: Not on file   Highest education level: Not on file  Occupational History   Not on file  Tobacco Use   Smoking status: Never   Smokeless tobacco: Never  Vaping Use   Vaping Use: Every day  Substance and Sexual Activity   Alcohol use: No   Drug use: Yes    Types: Marijuana   Sexual activity: Not Currently    Birth control/protection:  Surgical    Comment: tubal  Other Topics Concern   Not on file  Social History Narrative    She lives with her father, 92 year old brother, and 63 year old daughter      Social Determinants of Health   Financial Resource Strain: Low Risk  (07/10/2019)   Overall Financial Resource Strain (CARDIA)    Difficulty of Paying Living Expenses: Not hard at all  Food Insecurity: Unknown (07/10/2019)   Hunger Vital Sign    Worried About Programme researcher, broadcasting/film/video in the Last Year: Never true    Ran Out of Food in the Last Year: Not on file  Transportation Needs: Not on file  Physical Activity: Not on file  Stress: Not on file  Social Connections: Not on file   Review of systems General: negative for malaise, night sweats, fever, chills, weight loss Neck: Negative for lumps, goiter, pain and significant neck swelling Resp: Negative for cough, wheezing, dyspnea at rest CV: Negative for chest pain, leg swelling, palpitations, orthopnea GI: denies hematochezia, diarrhea, constipation, dysphagia, odyonophagia, early satiety or unintentional weight loss. +nausea, +vomiting +melena  MSK: Negative for joint pain or swelling, back pain, and muscle pain. Derm: Negative for itching or rash Psych: Denies depression, anxiety, memory loss, confusion. No homicidal or suicidal ideation.  Heme: Negative for prolonged bleeding, bruising easily, and swollen nodes. Endocrine: Negative for cold or heat intolerance, polyuria, polydipsia and goiter. Neuro: negative for tremor, gait imbalance, syncope and seizures. The remainder of the review of systems is noncontributory.  Physical Exam: BP 107/69 (BP Location: Left Arm, Patient Position: Sitting, Cuff Size: Small)   Pulse 96   Temp 97.6 F (36.4 C) (Oral)   Ht 5\' 7"  (1.702 m)   Wt 166 lb 11.2 oz (75.6 kg)   BMI 26.11 kg/m  General:   Alert and oriented. No distress noted. Pleasant and cooperative.  Head:  Normocephalic and atraumatic. Eyes:  Conjuctiva clear  without scleral icterus. Mouth:  Oral mucosa pink and moist. Good dentition. No lesions. Heart: Normal rate and rhythm, s1 and s2 heart sounds present.  Lungs: Clear lung sounds in all lobes. Respirations equal and unlabored. Abdomen:  +BS, soft, non-tender and non-distended. No rebound or guarding. No HSM or masses noted. Derm: No palmar erythema or jaundice Msk:  Symmetrical without gross deformities. Normal posture. Extremities:  Without edema. Neurologic:  Alert and  oriented x4 Psych:  Alert and cooperative. Normal mood and affect.  Invalid input(s): "6 MONTHS"   ASSESSMENT: Jamie Maynard is a 26 y.o. female presenting today as a new patient for nausea and vomiting.   Patient with reports of intermittent episodes of nausea and vomiting for the past year.  Recent ED visit August with unremarkable CT imaging and labs.  Patient does report melanotic stools during these episodes of nausea and vomiting as well as  mid abdominal pain.  She denies any alcohol use or frequent NSAIDs.  She does report smoking marijuana 1-2 times per week and states that during these episodes she actually feels better if she smokes marijuana.  She denies any GERD symptoms other than during times of nausea vomiting.  Recommend proceeding with EGD for further evaluation of her symptoms as you can rule out gastritis, duodenitis, PUD.  I did discuss with patient she may ultimately be suffering from cannabinoid induced hyperemesis given marijuana use, recommend avoiding MJ at this time. Indications, risks and benefits of procedure discussed in detail with patient. Patient verbalized understanding and is in agreement to proceed with EGD at this time. Patient does have anti emetics left over from recent ED visit, she can continue to use these PRN.   PLAN:  Schedule EGD ASA I 2. Avoid NSAIDs and ETOH  3. Anti emetics PRN  4. Avoid MJ use  All questions were answered, patient verbalized understanding and is in  agreement with plan as outlined above.   Follow Up: 3 months   Reyaan Thoma L. Jeanmarie Hubert, MSN, APRN, AGNP-C Adult-Gerontology Nurse Practitioner [facility]

## 2022-07-10 NOTE — H&P (View-Only) (Signed)
 Referring Provider: Daniel, Terry G, MD Primary Care Physician:  Daniel, Terry G, MD Primary GI Physician: new  Chief Complaint  Patient presents with   Nausea    Patient here today due to bouts of nausea and vomiting that occurs off and on every few weeks.   HPI:   Jamie Maynard is a 25 y.o. female with no significant past medical history.   Patient presenting today as a new patient for nausea and vomiting.   H pylori serology negative in June 2023.   Labs 07/01/22 AST 24, ALT 27, AP 57, T bili 0.8, lipase normal at 48, WBC 11.5, hgb 14.5, UDS + for cannabinoids  Patient reports for the past year she has been having episodes every few weeks with nausea, vomiting and abdominal pain. She reports mid abdominal pain when this occurs as well as some diarrhea. She also endorses acid reflux symptoms after she has nausea and vomiting. She denies GERD symptoms at other times. She endorses black stools at the time of her acute illness episodes. Denies rectal bleeding. No significant weight loss other than during episodes of nausea and vomiting. Recent ED visit at [facility] on 07/01/22 with no significant findings. She reports multiple ED visits for the same. She denies alcohol use. She does endorse marijuana use, maybe 1-2x/week. She does note that sometimes when she has nausea and vomiting she will smoke MJ which will help with her symptoms.   NSAID use: none  Social hx:no etoh,  Fam hx:no crc, liver disease or pancreatic cancer  CT A/P with contrast: 07/01/22 Trace nonspecific free pelvic fluid.  No acute intra-abdominal or intrapelvic abnormalities otherwise  seen.  Last Colonoscopy:never  Last Endoscopy:never  Recommendations:    Past Medical History:  Diagnosis Date   Medical history non-contributory     Past Surgical History:  Procedure Laterality Date   DILATATION AND CURETTAGE/HYSTEROSCOPY WITH MINERVA N/A 02/08/2021   Procedure: DILATATION AND CURETTAGE/HYSTEROSCOPY WITH MINERVA;   Surgeon: Eure, Luther H, MD;  Location: [facility];  Service: Gynecology;  Laterality: N/A;   IUD REMOVAL N/A 06/01/2020   Procedure: INTRAUTERINE DEVICE (IUD) REMOVAL;  Surgeon: Eure, Luther H, MD;  Location: [facility];  Service: Gynecology;  Laterality: N/A;   LAPAROSCOPIC TUBAL LIGATION Bilateral 06/01/2020   Procedure: LAPAROSCOPIC BILATERAL TUBAL LIGATION USING ELECTROCAUTERY;  Surgeon: Eure, Luther H, MD;  Location: [facility];  Service: Gynecology;  Laterality: Bilateral;   TONSILLECTOMY AND ADENOIDECTOMY Bilateral 2003   Performed at [facility]    Current Outpatient Medications  Medication Sig Dispense Refill   ondansetron (ZOFRAN ODT) 8 MG disintegrating tablet Take 1 tablet (8 mg total) by mouth every 8 (eight) hours as needed for nausea or vomiting. 8 tablet 0   No current facility-administered medications for this visit.    Allergies as of 07/10/2022 - Review Complete 07/10/2022  Allergen Reaction Noted   Other Hives and Rash 09/08/2015    Family History  Problem Relation Age of Onset   Depression Father    Stroke Father    Hypertension Father    Heart disease Father    Heart attack Maternal Grandfather    Congestive Heart Failure Paternal Grandmother    Migraines Paternal Grandmother    COPD Paternal Grandmother    Lung cancer Paternal Grandfather    Autism Cousin        2 paternal first cousins have autism   ADD / ADHD Cousin        Several paternal first cousins   have ADHD/ADD    Social History   Socioeconomic History   Marital status: Single    Spouse name: Not on file   Number of children: 2   Years of education: Not on file   Highest education level: Not on file  Occupational History   Not on file  Tobacco Use   Smoking status: Never   Smokeless tobacco: Never  Vaping Use   Vaping Use: Every day  Substance and Sexual Activity   Alcohol use: No   Drug use: Yes    Types: Marijuana   Sexual activity: Not Currently    Birth control/protection:  Surgical    Comment: tubal  Other Topics Concern   Not on file  Social History Narrative    She lives with her father, 92 year old brother, and 63 year old daughter      Social Determinants of Health   Financial Resource Strain: Low Risk  (07/10/2019)   Overall Financial Resource Strain (CARDIA)    Difficulty of Paying Living Expenses: Not hard at all  Food Insecurity: Unknown (07/10/2019)   Hunger Vital Sign    Worried About Programme researcher, broadcasting/film/video in the Last Year: Never true    Ran Out of Food in the Last Year: Not on file  Transportation Needs: Not on file  Physical Activity: Not on file  Stress: Not on file  Social Connections: Not on file   Review of systems General: negative for malaise, night sweats, fever, chills, weight loss Neck: Negative for lumps, goiter, pain and significant neck swelling Resp: Negative for cough, wheezing, dyspnea at rest CV: Negative for chest pain, leg swelling, palpitations, orthopnea GI: denies hematochezia, diarrhea, constipation, dysphagia, odyonophagia, early satiety or unintentional weight loss. +nausea, +vomiting +melena  MSK: Negative for joint pain or swelling, back pain, and muscle pain. Derm: Negative for itching or rash Psych: Denies depression, anxiety, memory loss, confusion. No homicidal or suicidal ideation.  Heme: Negative for prolonged bleeding, bruising easily, and swollen nodes. Endocrine: Negative for cold or heat intolerance, polyuria, polydipsia and goiter. Neuro: negative for tremor, gait imbalance, syncope and seizures. The remainder of the review of systems is noncontributory.  Physical Exam: BP 107/69 (BP Location: Left Arm, Patient Position: Sitting, Cuff Size: Small)   Pulse 96   Temp 97.6 F (36.4 C) (Oral)   Ht 5\' 7"  (1.702 m)   Wt 166 lb 11.2 oz (75.6 kg)   BMI 26.11 kg/m  General:   Alert and oriented. No distress noted. Pleasant and cooperative.  Head:  Normocephalic and atraumatic. Eyes:  Conjuctiva clear  without scleral icterus. Mouth:  Oral mucosa pink and moist. Good dentition. No lesions. Heart: Normal rate and rhythm, s1 and s2 heart sounds present.  Lungs: Clear lung sounds in all lobes. Respirations equal and unlabored. Abdomen:  +BS, soft, non-tender and non-distended. No rebound or guarding. No HSM or masses noted. Derm: No palmar erythema or jaundice Msk:  Symmetrical without gross deformities. Normal posture. Extremities:  Without edema. Neurologic:  Alert and  oriented x4 Psych:  Alert and cooperative. Normal mood and affect.  Invalid input(s): "6 MONTHS"   ASSESSMENT: Jamie Maynard is a 26 y.o. female presenting today as a new patient for nausea and vomiting.   Patient with reports of intermittent episodes of nausea and vomiting for the past year.  Recent ED visit August with unremarkable CT imaging and labs.  Patient does report melanotic stools during these episodes of nausea and vomiting as well as  mid abdominal pain.  She denies any alcohol use or frequent NSAIDs.  She does report smoking marijuana 1-2 times per week and states that during these episodes she actually feels better if she smokes marijuana.  She denies any GERD symptoms other than during times of nausea vomiting.  Recommend proceeding with EGD for further evaluation of her symptoms as you can rule out gastritis, duodenitis, PUD.  I did discuss with patient she may ultimately be suffering from cannabinoid induced hyperemesis given marijuana use, recommend avoiding MJ at this time. Indications, risks and benefits of procedure discussed in detail with patient. Patient verbalized understanding and is in agreement to proceed with EGD at this time. Patient does have anti emetics left over from recent ED visit, she can continue to use these PRN.   PLAN:  Schedule EGD ASA I 2. Avoid NSAIDs and ETOH  3. Anti emetics PRN  4. Avoid MJ use  All questions were answered, patient verbalized understanding and is in  agreement with plan as outlined above.   Follow Up: 3 months   Dayana Dalporto L. Jeanmarie Hubert, MSN, APRN, AGNP-C Adult-Gerontology Nurse Practitioner [facility]

## 2022-07-10 NOTE — Patient Instructions (Signed)
We will get you scheduled for upper endoscopy for further evaluation of your symptoms Please avoid NSAIDs (advil, aleve, naproxen, goody powder, ibuprofen) as these can be very hard on your GI tract, causing inflammation, ulcers and damage to the lining of your GI tract.  Please also be mindful that marijuana use could be contributing to your symptoms, as we discussed. You can use nausea medicine you have on hand, as needed  Follow up 3 months

## 2022-07-11 ENCOUNTER — Other Ambulatory Visit (HOSPITAL_COMMUNITY)
Admission: RE | Admit: 2022-07-11 | Discharge: 2022-07-11 | Disposition: A | Discharge status: Home / Self Care | Payer: Medicaid Other | Source: Ambulatory Visit | Attending: Gastroenterology | Admitting: Gastroenterology

## 2022-07-11 ENCOUNTER — Encounter (HOSPITAL_COMMUNITY)
Admission: RE | Admit: 2022-07-11 | Discharge: 2022-07-11 | Disposition: A | Discharge status: Home / Self Care | Payer: Medicaid Other | Source: Ambulatory Visit | Attending: Gastroenterology | Admitting: Gastroenterology

## 2022-07-11 ENCOUNTER — Other Ambulatory Visit (INDEPENDENT_AMBULATORY_CARE_PROVIDER_SITE_OTHER): Payer: Self-pay

## 2022-07-11 DIAGNOSIS — Z01818 Encounter for other preprocedural examination: Secondary | ICD-10-CM

## 2022-07-11 DIAGNOSIS — Z01812 Encounter for preprocedural laboratory examination: Secondary | ICD-10-CM

## 2022-07-11 LAB — PREGNANCY, URINE: Preg Test, Ur: NEGATIVE

## 2022-07-13 ENCOUNTER — Encounter (HOSPITAL_COMMUNITY)
Admission: RE | Disposition: A | Discharge status: Home / Self Care | Payer: Self-pay | Source: Home / Self Care | Attending: Gastroenterology

## 2022-07-13 ENCOUNTER — Encounter (HOSPITAL_COMMUNITY): Payer: Self-pay | Admitting: Gastroenterology

## 2022-07-13 ENCOUNTER — Ambulatory Visit (HOSPITAL_COMMUNITY)
Admission: RE | Admit: 2022-07-13 | Discharge: 2022-07-13 | Disposition: A | Discharge status: Home / Self Care | Payer: Medicaid Other | Attending: Gastroenterology | Admitting: Gastroenterology

## 2022-07-13 ENCOUNTER — Ambulatory Visit (HOSPITAL_BASED_OUTPATIENT_CLINIC_OR_DEPARTMENT_OTHER): Payer: Medicaid Other | Admitting: Anesthesiology

## 2022-07-13 ENCOUNTER — Ambulatory Visit (HOSPITAL_COMMUNITY): Payer: Medicaid Other | Admitting: Anesthesiology

## 2022-07-13 DIAGNOSIS — K219 Gastro-esophageal reflux disease without esophagitis: Secondary | ICD-10-CM | POA: Diagnosis not present

## 2022-07-13 DIAGNOSIS — R112 Nausea with vomiting, unspecified: Secondary | ICD-10-CM

## 2022-07-13 DIAGNOSIS — Z01818 Encounter for other preprocedural examination: Secondary | ICD-10-CM

## 2022-07-13 HISTORY — PX: BIOPSY: SHX5522

## 2022-07-13 HISTORY — PX: ESOPHAGOGASTRODUODENOSCOPY (EGD) WITH PROPOFOL: SHX5813

## 2022-07-13 SURGERY — ESOPHAGOGASTRODUODENOSCOPY (EGD) WITH PROPOFOL
Anesthesia: General

## 2022-07-13 MED ORDER — LIDOCAINE HCL (CARDIAC) PF 100 MG/5ML IV SOSY
PREFILLED_SYRINGE | INTRAVENOUS | Status: DC | PRN
  Administered 2022-07-13: 50 mg via INTRATRACHEAL

## 2022-07-13 MED ORDER — LACTATED RINGERS IV SOLN: INTRAVENOUS | Status: DC

## 2022-07-13 MED ORDER — PROPOFOL 10 MG/ML IV BOLUS
INTRAVENOUS | Status: DC | PRN
  Administered 2022-07-13: 50 mg via INTRAVENOUS
  Administered 2022-07-13: 100 mg via INTRAVENOUS
  Administered 2022-07-13: 50 mg via INTRAVENOUS

## 2022-07-13 NOTE — Interval H&P Note (Signed)
History and Physical Interval Note:  07/13/2022 8:46 AM  Jamie Maynard  has presented today for surgery, with the diagnosis of Nausea vomiting.  The various methods of treatment have been discussed with the patient and family. After consideration of risks, benefits and other options for treatment, the patient has consented to  Procedure(s) with comments: ESOPHAGOGASTRODUODENOSCOPY (EGD) WITH PROPOFOL (N/A) - 945 ASA 2 as a surgical intervention.  The patient's history has been reviewed, patient examined, no change in status, stable for surgery.  I have reviewed the patient's chart and labs.  Questions were answered to the patient's satisfaction.     [name]

## 2022-07-13 NOTE — Discharge Instructions (Signed)
You are being discharged to home.  Resume your previous diet.  Absolute mairjuana cessation. May consider low dose TCA if presenting recurrent nausea and vomiting despite marijuana cessation.  We are waiting for your pathology results.

## 2022-07-13 NOTE — Transfer of Care (Signed)
Immediate Anesthesia Transfer of Care Note  Patient: Jamie Maynard  Procedure(s) Performed: ESOPHAGOGASTRODUODENOSCOPY (EGD) WITH PROPOFOL BIOPSY  Patient Location: Short Stay  Anesthesia Type:General  Level of Consciousness: sedated and patient cooperative  Airway & Oxygen Therapy: Patient Spontanous Breathing  Post-op Assessment: Report given to RN and Post -op Vital signs reviewed and stable  Post vital signs: Reviewed and stable  Last Vitals:  Vitals Value Taken Time  BP    Temp    Pulse    Resp    SpO2      Last Pain:  Vitals:   07/13/22 0943  TempSrc:   PainSc: 0-No pain      Patients Stated Pain Goal: 5 (07/13/22 0840)  Complications: No notable events documented.

## 2022-07-13 NOTE — Op Note (Signed)
[facility] Patient Name: Jamie Maynard Procedure Date: 07/13/2022 9:34 AM MRN: [id number] Date of Birth: 1996-09-09 Attending MD: Maylon Peppers ,  CSN: [id number] Age: 26 Admit Type: Outpatient Procedure:                Upper GI endoscopy Indications:              Nausea with vomiting Providers:                Maylon Peppers, Caprice Kluver, Randa Spike,                            Technician Referring MD:              Medicines:                Monitored Anesthesia Care Complications:            No immediate complications. Estimated Blood Loss:     Estimated blood loss: none. Procedure:                Pre-Anesthesia Assessment:                           - Prior to the procedure, a History and Physical                            was performed, and patient medications, allergies                            and sensitivities were reviewed. The patient's                            tolerance of previous anesthesia was reviewed.                           - The risks and benefits of the procedure and the                            sedation options and risks were discussed with the                            patient. All questions were answered and informed                            consent was obtained.                           - ASA Grade Assessment: II - A patient with mild                            systemic disease.                           After obtaining informed consent, the endoscope was                            passed under direct vision. Throughout the  procedure, the patient's blood pressure, pulse, and                            oxygen saturations were monitored continuously. The                            [id number]-H190 ([id number]) scope was introduced through the                            mouth, and advanced to the second part of duodenum.                            The upper GI endoscopy was accomplished without                             difficulty. The patient tolerated the procedure                            well. Scope In: 9:47:21 AM Scope Out: 9:51:25 AM Total Procedure Duration: 0 hours 4 minutes 4 seconds  Findings:      The esophagus was normal.      The stomach was normal.      The examined duodenum was normal. Biopsies were taken with a cold       forceps for histology.      Note: vomiting episodes is likely related to cannabinoid use. Impression:               - Normal esophagus.                           - Normal stomach.                           - Normal examined duodenum. Biopsied. Moderate Sedation:      Per Anesthesia Care Recommendation:           - Discharge patient to home (ambulatory).                           - Resume previous diet.                           - Continue Zofran and Phenergan as needed for                            nausea.                           - Absolute mairjuana cessation.                           - May consider low dose TCA if presenting recurrent                            nausea and vomiting despite marijuana cessation.                           -  Await pathology results. Procedure Code(s):        --- Professional ---                           [phone], Esophagogastroduodenoscopy, flexible,                            transoral; with biopsy, single or multiple Diagnosis Code(s):        --- Professional ---                           R11.2, Nausea with vomiting, unspecified CPT copyright 2019 American Medical Association. All rights reserved. The codes documented in this report are preliminary and upon coder review may  be revised to meet current compliance requirements. Katrinka Blazing, MD Katrinka Blazing,  07/13/2022 10:01:48 AM This report has been signed electronically. Number of Addenda: 0

## 2022-07-13 NOTE — Anesthesia Postprocedure Evaluation (Signed)
Anesthesia Post Note  Patient: Jamie Maynard  Procedure(s) Performed: ESOPHAGOGASTRODUODENOSCOPY (EGD) WITH PROPOFOL BIOPSY  Patient location during evaluation: Phase II Anesthesia Type: General Level of consciousness: awake and alert and oriented Pain management: pain level controlled Vital Signs Assessment: post-procedure vital signs reviewed and stable Respiratory status: spontaneous breathing, nonlabored ventilation and respiratory function stable Cardiovascular status: blood pressure returned to baseline and stable Postop Assessment: no apparent nausea or vomiting Anesthetic complications: no   No notable events documented.   Last Vitals:  Vitals:   07/13/22 0840 07/13/22 0958  BP: 112/66 (!) 97/53  Pulse: 83 68  Resp: 18 18  Temp: 36.9 C 36.8 C  SpO2: 99% 95%    Last Pain:  Vitals:   07/13/22 0958  TempSrc: Oral  PainSc: 0-No pain                 Jesiah Grismer C Jilian West

## 2022-07-13 NOTE — Anesthesia Preprocedure Evaluation (Signed)
Anesthesia Evaluation  Patient identified by MRN, date of birth, ID band Patient awake    Reviewed: Allergy & Precautions, NPO status , Patient's Chart, lab work & pertinent test results  Airway Mallampati: II  TM Distance: >3 FB Neck ROM: Full    Dental  (+) Dental Advisory Given, Teeth Intact   Pulmonary neg pulmonary ROS,    Pulmonary exam normal breath sounds clear to auscultation       Cardiovascular negative cardio ROS Normal cardiovascular exam Rhythm:Regular Rate:Normal     Neuro/Psych PSYCHIATRIC DISORDERS Depression negative neurological ROS     GI/Hepatic negative GI ROS, (+)     substance abuse (smoked marijuana at around 6am)  marijuana use,   Endo/Other  negative endocrine ROS  Renal/GU negative Renal ROS  negative genitourinary   Musculoskeletal negative musculoskeletal ROS (+)   Abdominal   Peds negative pediatric ROS (+)  Hematology negative hematology ROS (+)   Anesthesia Other Findings   Reproductive/Obstetrics negative OB ROS                            Anesthesia Physical Anesthesia Plan  ASA: 2  Anesthesia Plan: General   Post-op Pain Management: Minimal or no pain anticipated   Induction: Intravenous  PONV Risk Score and Plan: Propofol infusion  Airway Management Planned: Nasal Cannula and Natural Airway  Additional Equipment:   Intra-op Plan:   Post-operative Plan:   Informed Consent: I have reviewed the patients History and Physical, chart, labs and discussed the procedure including the risks, benefits and alternatives for the proposed anesthesia with the patient or authorized representative who has indicated his/her understanding and acceptance.     Dental advisory given  Plan Discussed with: CRNA and Surgeon  Anesthesia Plan Comments:         Anesthesia Quick Evaluation

## 2022-07-16 ENCOUNTER — Other Ambulatory Visit: Payer: Self-pay | Admitting: Gastroenterology

## 2022-07-16 DIAGNOSIS — R112 Nausea with vomiting, unspecified: Secondary | ICD-10-CM

## 2022-07-16 LAB — SURGICAL PATHOLOGY

## 2022-07-16 MED ORDER — PROMETHAZINE HCL 25 MG PO TABS: 25.0000 mg | ORAL_TABLET | Freq: Four times a day (QID) | ORAL | 0 refills | Status: DC | PRN

## 2022-07-20 ENCOUNTER — Encounter (HOSPITAL_COMMUNITY): Payer: Self-pay | Admitting: Gastroenterology

## 2022-07-23 ENCOUNTER — Telehealth (INDEPENDENT_AMBULATORY_CARE_PROVIDER_SITE_OTHER): Payer: Self-pay | Admitting: *Deleted

## 2022-07-23 NOTE — Telephone Encounter (Signed)
Patient started having bleeding when trying have BM. She has not had BM since last Thursday. Having pain lower abdomen. Feels uncomfortable constantly but worse when trying to have BM. No fever, no dizziness, no sob, no fatigue. Took a stool softner. Worried about taking anything because of the pain when she tries to have BM.  Had EGD on 9/8. She wanted appt. None available today.   [phone]

## 2022-07-23 NOTE — Telephone Encounter (Signed)
Discussed with patient per [place] like she may be constipated, lets have her Start taking Miralax 1 capful every day for 2 days, If bowel movements do not improve, increase to 1 capful every 12 hours. If she begins having nausea/vomiting or worsening bleeding she needs to proceed to the ED.   Patient verbalized understanding.

## 2022-07-24 ENCOUNTER — Other Ambulatory Visit: Payer: Self-pay

## 2022-07-24 ENCOUNTER — Emergency Department (HOSPITAL_COMMUNITY)
Admission: EM | Admit: 2022-07-24 | Discharge: 2022-07-24 | Disposition: A | Discharge status: Home / Self Care | Payer: Medicaid Other | Attending: Emergency Medicine | Admitting: Emergency Medicine

## 2022-07-24 ENCOUNTER — Encounter (HOSPITAL_COMMUNITY): Payer: Self-pay

## 2022-07-24 DIAGNOSIS — E876 Hypokalemia: Secondary | ICD-10-CM | POA: Diagnosis not present

## 2022-07-24 DIAGNOSIS — R1084 Generalized abdominal pain: Secondary | ICD-10-CM | POA: Insufficient documentation

## 2022-07-24 DIAGNOSIS — R112 Nausea with vomiting, unspecified: Secondary | ICD-10-CM | POA: Insufficient documentation

## 2022-07-24 LAB — URINALYSIS, ROUTINE W REFLEX MICROSCOPIC
Bilirubin Urine: NEGATIVE
Glucose, UA: NEGATIVE mg/dL
Hgb urine dipstick: NEGATIVE
Ketones, ur: 80 mg/dL — AB
Nitrite: NEGATIVE
Protein, ur: 30 mg/dL — AB
Specific Gravity, Urine: 1.018 (ref 1.005–1.030)
pH: 8 (ref 5.0–8.0)

## 2022-07-24 LAB — COMPREHENSIVE METABOLIC PANEL
ALT: 18 U/L (ref 0–44)
AST: 16 U/L (ref 15–41)
Albumin: 4.5 g/dL (ref 3.5–5.0)
Alkaline Phosphatase: 64 U/L (ref 38–126)
Anion gap: 12 (ref 5–15)
BUN: 8 mg/dL (ref 6–20)
CO2: 16 mmol/L — ABNORMAL LOW (ref 22–32)
Calcium: 9.5 mg/dL (ref 8.9–10.3)
Chloride: 108 mmol/L (ref 98–111)
Creatinine, Ser: 0.72 mg/dL (ref 0.44–1.00)
GFR, Estimated: >60 mL/min (ref >60)
Glucose, Bld: 123 mg/dL — ABNORMAL HIGH (ref 70–99)
Potassium: 3.2 mmol/L — ABNORMAL LOW (ref 3.5–5.1)
Sodium: 136 mmol/L (ref 135–145)
Total Bilirubin: 0.7 mg/dL (ref 0.3–1.2)
Total Protein: 7.5 g/dL (ref 6.5–8.1)

## 2022-07-24 LAB — CBC
HCT: 43.4 % (ref 36.0–46.0)
Hemoglobin: 15.1 g/dL — ABNORMAL HIGH (ref 12.0–15.0)
MCH: 31.3 pg (ref 26.0–34.0)
MCHC: 34.8 g/dL (ref 30.0–36.0)
MCV: 89.9 fL (ref 80.0–100.0)
Platelets: 328 10*3/uL (ref 150–400)
RBC: 4.83 MIL/uL (ref 3.87–5.11)
RDW: 12.3 % (ref 11.5–15.5)
WBC: 11.1 10*3/uL — ABNORMAL HIGH (ref 4.0–10.5)
nRBC: 0 % (ref 0.0–0.2)

## 2022-07-24 LAB — CBG MONITORING, ED: Glucose-Capillary: 130 mg/dL — ABNORMAL HIGH (ref 70–99)

## 2022-07-24 LAB — LIPASE, BLOOD: Lipase: 28 U/L (ref 11–51)

## 2022-07-24 LAB — POC URINE PREG, ED: Preg Test, Ur: NEGATIVE

## 2022-07-24 MED ORDER — PANTOPRAZOLE SODIUM 40 MG PO TBEC: 40.0000 mg | DELAYED_RELEASE_TABLET | Freq: Every day | ORAL | 0 refills | Status: DC

## 2022-07-24 MED ORDER — METOCLOPRAMIDE HCL 5 MG/ML IJ SOLN
10.0000 mg | Freq: Once | INTRAMUSCULAR | Status: AC
  Administered 2022-07-24: 10 mg via INTRAVENOUS
  Filled 2022-07-24: qty 2

## 2022-07-24 MED ORDER — HALOPERIDOL LACTATE 5 MG/ML IJ SOLN
2.5000 mg | Freq: Once | INTRAMUSCULAR | Status: AC
Start: 2022-07-24 — End: 2022-07-24
  Administered 2022-07-24: 2.5 mg via INTRAVENOUS
  Filled 2022-07-24: qty 1

## 2022-07-24 MED ORDER — CAPSAICIN 0.025 % EX CREA
TOPICAL_CREAM | Freq: Once | CUTANEOUS | Status: AC
  Filled 2022-07-24: qty 60

## 2022-07-24 MED ORDER — SODIUM CHLORIDE 0.9 % IV BOLUS
1000.0000 mL | Freq: Once | INTRAVENOUS | Status: AC
  Administered 2022-07-24: 1000 mL via INTRAVENOUS

## 2022-07-24 NOTE — Discharge Instructions (Signed)
Frequent small sips of clear fluids today.  Bland diet as tolerated starting tomorrow.  As discussed, please discontinue marijuana use as this most likely exacerbating your nausea and vomiting.  Follow-up with your primary care provider for recheck.  Return emergency department for any new or worsening symptoms.

## 2022-07-24 NOTE — ED Provider Notes (Signed)
[facility] EMERGENCY DEPARTMENT Provider Note   CSN: [id number] Arrival date & time: 07/24/22  1119     History  Chief Complaint  Patient presents with   Emesis    Jamie Maynard is a 26 y.o. female.   Emesis Associated symptoms: no abdominal pain, no arthralgias, no cough, no fever, no myalgias and no sore throat        Jamie Maynard is a 26 y.o. female who presents to the Emergency Department complaining of nausea vomiting that began this morning.  She reports multiple episodes since onset.  Decreased appetite.  Unable to tolerate food or fluids.  She also states that she has not had a bowel movement since Thursday.  She is having diffuse cramping pain of her mid abdomen.  She states that she this is a reoccurring problem for her for some time.  She recently underwent EGD here without significant finding.  She has been taking Phenergan without relief.  She does endorse recent marijuana use 2 days ago.  She is currently followed by a local GI.  She denies any fever, chills, chest pain, shortness of breath, bowel changes or hematemesis.   Home Medications Prior to Admission medications   Medication Sig Start Date End Date Taking? Authorizing Provider  ondansetron (ZOFRAN ODT) 8 MG disintegrating tablet Take 1 tablet (8 mg total) by mouth every 8 (eight) hours as needed for nausea or vomiting. 02/08/21   Florian Buff, MD  promethazine (PHENERGAN) 25 MG tablet Take 1 tablet (25 mg total) by mouth every 6 (six) hours as needed for nausea or vomiting. 07/16/22   Jamie Quale, MD      Allergies    Other    Review of Systems   Review of Systems  Constitutional:  Negative for fever.  HENT:  Negative for sore throat and trouble swallowing.   Respiratory:  Negative for cough and shortness of breath.   Cardiovascular:  Negative for chest pain.  Gastrointestinal:  Positive for nausea and vomiting. Negative for abdominal pain.  Musculoskeletal:  Negative for  arthralgias and myalgias.  Skin:  Negative for rash.  Neurological:  Negative for dizziness, syncope, weakness and numbness.    Physical Exam Updated Vital Signs BP (!) 144/94 (BP Location: Left Arm)   Pulse 81   Temp (!) 97.5 F (36.4 C) (Oral)   Resp 18   Ht 5\' 7"  (1.702 m)   Wt 73 kg   LMP 06/21/2022   SpO2 98%   BMI 25.22 kg/m  Physical Exam Vitals and nursing note reviewed.  Constitutional:      General: She is not in acute distress.    Appearance: Normal appearance. She is not toxic-appearing.     Comments: Patient actively vomiting on arrival  HENT:     Mouth/Throat:     Mouth: Mucous membranes are dry.  Cardiovascular:     Rate and Rhythm: Normal rate and regular rhythm.     Pulses: Normal pulses.  Pulmonary:     Effort: Pulmonary effort is normal.  Abdominal:     General: There is no distension.     Palpations: Abdomen is soft.     Tenderness: There is no abdominal tenderness.  Skin:    General: Skin is warm.     Capillary Refill: Capillary refill takes less than 2 seconds.  Neurological:     General: No focal deficit present.     Mental Status: She is alert.     Sensory: No  sensory deficit.     Motor: No weakness.     ED Results / Procedures / Treatments   Labs (all labs ordered are listed, but only abnormal results are displayed) Labs Reviewed  CBG MONITORING, ED - Abnormal; Notable for the following components:      Result Value   Glucose-Capillary 130 (*)    All other components within normal limits  LIPASE, BLOOD  COMPREHENSIVE METABOLIC PANEL  CBC  URINALYSIS, ROUTINE W REFLEX MICROSCOPIC  POC URINE PREG, ED    EKG EKG Interpretation  Date/Time:  Tuesday July 24 2022 14:28:28 EDT Ventricular Rate:  71 PR Interval:  143 QRS Duration: 95 QT Interval:  420 QTC Calculation: 457 R Axis:   84 Text Interpretation: Sinus rhythm Normal ECG Confirmed by Eber Hong ([id number]) on 07/24/2022 5:05:10 PM  Radiology No results  found.  Procedures Procedures    Medications Ordered in ED Medications  sodium chloride 0.9 % bolus 1,000 mL (has no administration in time range)  metoCLOPramide (REGLAN) injection 10 mg (has no administration in time range)    ED Course/ Medical Decision Making/ A&P                           Medical Decision Making Patient here for persistent vomiting that began this morning  This is a recurrent problem for her.  Admits to marijuana use 2 days ago.  On exam, patient actively vomiting upon arrival.  Abdomen is soft and nontender.  Patient nontoxic-appearing.  Physical exam reassuring.  No complaints of dysuria.  Torrential diagnosis would include but not limited to gastroenteritis, viral illness, cannabinoid hyperemesis.  I suspect patient's symptoms are related to marijuana use.  Will provide IV fluids, antiemetic and check labs.  Medically, I have low suspicion for acute abdominal process.  Amount and/or Complexity of Data Reviewed Labs: ordered.    Details: Labs interpreted by me, no evidence of significant leukocytosis, chemistries show mild hypokalemia with potassium of 3.2.  Urine pregnancy test negative.  Urinalysis shows moderate leukocytes with 21-50 white cells 11-20 squamous cells.  This is likely secondary to contaminant.  Patient denies having any dysuria symptoms.  We will culture her urine.  Lipase unremarkable ECG/medicine tests: ordered.    Details: EKG shows normal sinus rhythm Discussion of management or test interpretation with external provider(s): Patient initially given IV fluids and antiemetic here.  She continued to complain of nausea and vomiting after receiving metoclopramide.  EKG was performed and patient was placed on cardiac monitoring.  She was given IV Haldol with significant improvement of her symptoms. On recheck, patient resting comfortably.  No further nausea or vomiting.  States her symptoms have resolved.  She has tolerated p.o. challenge.  She  has promethazine at home.  Prescription written for PPI.  I feel that she is appropriate for discharge home.  She is agreeable to this plan.  She will follow-up outpatient with PCP.  Return precautions were discussed.  Risk OTC drugs. Prescription drug management.           Final Clinical Impression(s) / ED Diagnoses Final diagnoses:  Nausea and vomiting, unspecified vomiting type    Rx / DC Orders ED Discharge Orders     None         Pauline Aus, PA-C 07/24/22 1736    Loetta Rough, MD 07/27/22 1521

## 2022-07-24 NOTE — ED Notes (Signed)
NT informed RN that pt was seen drinking water from the sink and vomiting it back up. Pt asked to not drink water as this is making her emesis worse. MD made aware by NT.

## 2022-07-24 NOTE — ED Triage Notes (Signed)
Pt presents to ED with complaints of emesis started today, no BM since last Thursday. Unable to eat

## 2022-07-24 NOTE — ED Notes (Signed)
Pt given water and crackers  

## 2022-10-15 ENCOUNTER — Encounter (INDEPENDENT_AMBULATORY_CARE_PROVIDER_SITE_OTHER): Payer: Self-pay | Admitting: Gastroenterology

## 2022-10-15 ENCOUNTER — Ambulatory Visit (INDEPENDENT_AMBULATORY_CARE_PROVIDER_SITE_OTHER): Payer: Medicaid Other | Admitting: Gastroenterology

## 2022-10-15 VITALS — BP 153/107 | HR 87 | Temp 97.6°F | Ht 67.0 in | Wt 153.3 lb

## 2022-10-15 DIAGNOSIS — R112 Nausea with vomiting, unspecified: Secondary | ICD-10-CM | POA: Diagnosis not present

## 2022-10-15 DIAGNOSIS — F1290 Cannabis use, unspecified, uncomplicated: Secondary | ICD-10-CM | POA: Diagnosis not present

## 2022-10-15 MED ORDER — DICYCLOMINE HCL 10 MG PO CAPS: 10.0000 mg | ORAL_CAPSULE | Freq: Two times a day (BID) | ORAL | 1 refills | Status: DC | PRN

## 2022-10-15 MED ORDER — PROMETHAZINE HCL 25 MG PO TABS: 25.0000 mg | ORAL_TABLET | Freq: Three times a day (TID) | ORAL | 1 refills | Status: DC | PRN

## 2022-10-15 NOTE — Progress Notes (Addendum)
Referring Provider: Richardean Chimera, MD Primary Care Physician:  Jamie Chimera, MD Primary GI Physician: Jamie Maynard   Chief Complaint  Patient presents with   Emesis    Follow up on vomiting. Reports vomiting is more often now than when seen. Vomits at least 4 days per week. Does not take any meds for vomiting.    HPI:   Jamie Maynard is a 26 y.o. female with no significant medical history other than cyclic vomiting.   Patient presenting today for follow up of nausea and vomiting.  Last seen September 2023, at that time having episodes of nausea, vomiting and abdominal pain for the past year as well as some GERD symptoms. Reported marijuana use 1-2x/week.  Recommended proceeding with EGD, avoid NSAIDs, ETOH and MJ. Anti emetics PRN.  After EGD, Patient recommended complete MJ cessation, if persistent symptoms can consider checking cortisol level and starting low dose TCA but will need to have had discontinuation of MJ x1 month prior to this.   Present:  **Notably with multiple ED visits at both [facility] and [facility], over the past year for nausea/vomiting/cannabis abuse. Last on 12/7 at [facility].  Patient actively vomiting during visit today. States she has been vomiting all weekend, but sick on and off for the past 3 weeks. She was given anti emetics in the ER at most recent visit on 12/7 but has taken all of these. Reports she has no anti emetics currently at home. She is only able to keep down minimal PO fluids. She has abdominal soreness from vomiting. She denies dizziness or lightheadedness. No episodes of syncope She is still smoking MJ. Last smoked about 4 days ago, she is trying to quit but has not been able to yet. She notes that she is urinating pretty regularly and that urine is mostly clear. Can keep some liquids down. Notably emesis in bag appears to be clear liquid.   She states that bentyl she has received in the ER in the past has helped some, requesting Rx of this and  anti emetics.   CT A/P with contrast: 08/20/22 normal  Last Colonoscopy:never Last Endoscopy:07/2022 Normal esophagus. - Normal stomach. - Normal examined duodenum. Biopsied-normal   Past Medical History:  Diagnosis Date   Medical history non-contributory     Past Surgical History:  Procedure Laterality Date   BIOPSY  07/13/2022   Procedure: BIOPSY;  Surgeon: Jamie Frame, MD;  [facility];  Service: Gastroenterology;;   DILATATION AND CURETTAGE/HYSTEROSCOPY WITH MINERVA N/A 02/08/2021   Procedure: DILATATION AND CURETTAGE/HYSTEROSCOPY WITH MINERVA;  Surgeon: Jamie Arms, MD;  Location: [facility] ORS;  Service: Gynecology;  Laterality: N/A;   ESOPHAGOGASTRODUODENOSCOPY (EGD) WITH PROPOFOL N/A 07/13/2022   Procedure: ESOPHAGOGASTRODUODENOSCOPY (EGD) WITH PROPOFOL;  Surgeon: Jamie Frame, MD;  Location: [facility];  Service: Gastroenterology;  Laterality: N/A;  945 ASA 2   IUD REMOVAL N/A 06/01/2020   Procedure: INTRAUTERINE DEVICE (IUD) REMOVAL;  Surgeon: Jamie Arms, MD;  Location: [facility] ORS;  Service: Gynecology;  Laterality: N/A;   LAPAROSCOPIC TUBAL LIGATION Bilateral 06/01/2020   Procedure: LAPAROSCOPIC BILATERAL TUBAL LIGATION USING ELECTROCAUTERY;  Surgeon: Jamie Arms, MD;  Location: [facility] OR[facility];  Service: Gynecology;  Laterality: Bilateral;   TONSILLECTOMY AND ADENOIDECTOMY Bilateral 2003   Performed at [facility]    Current Outpatient Medications  Medication Sig Dispense Refill   ondansetron (ZOFRAN ODT) 8 MG disintegrating tablet Take 1 tablet (8 mg total) by mouth every 8 (eight)  hours as needed for nausea or vomiting. (Patient not taking: Reported on 10/15/2022) 8 tablet 0   pantoprazole (PROTONIX) 40 MG tablet Take 1 tablet (40 mg total) by mouth daily. (Patient not taking: Reported on 10/15/2022) 30 tablet 0   promethazine (PHENERGAN) 25 MG tablet Take 1 tablet (25 mg total) by mouth every 6 (six) hours as needed for nausea or  vomiting. (Patient not taking: Reported on 10/15/2022) 30 tablet 0   No current facility-administered medications for this visit.    Allergies as of 10/15/2022 - Review Complete 10/15/2022  Allergen Reaction Noted   Other Hives and Rash 09/08/2015    Family History  Problem Relation Age of Onset   Depression Father    Stroke Father    Hypertension Father    Heart disease Father    Heart attack Maternal Grandfather    Congestive Heart Failure Paternal Grandmother    Migraines Paternal Grandmother    COPD Paternal Grandmother    Lung cancer Paternal Grandfather    Autism Cousin        2 paternal first cousins have autism   ADD / ADHD Cousin        Several paternal first cousins have ADHD/ADD    Social History   Socioeconomic History   Marital status: Single    Spouse name: Not on file   Number of children: 2   Years of education: Not on file   Highest education level: Not on file  Occupational History   Not on file  Tobacco Use   Smoking status: Never    Passive exposure: Never   Smokeless tobacco: Never  Vaping Use   Vaping Use: Every day  Substance and Sexual Activity   Alcohol use: No   Drug use: Yes    Types: Marijuana   Sexual activity: Not Currently    Birth control/protection: Surgical    Comment: tubal  Other Topics Concern   Not on file  Social History Narrative    She lives with her father, 35 year old brother, and 81 year old daughter      Social Determinants of Health   Financial Resource Strain: Low Risk  (07/10/2019)   Overall Financial Resource Strain (CARDIA)    Difficulty of Paying Living Expenses: Not hard at all  Food Insecurity: Unknown (07/10/2019)   Hunger Vital Sign    Worried About Programme researcher, broadcasting/film/video in the Last Year: Never true    Ran Out of Food in the Last Year: Not on file  Transportation Needs: Not on file  Physical Activity: Not on file  Stress: Not on file  Social Connections: Not on file   Review of systems General:  negative for malaise, night sweats, fever, chills, weight loss Neck: Negative for lumps, goiter, pain and significant neck swelling Resp: Negative for cough, wheezing, dyspnea at rest CV: Negative for chest pain, leg swelling, palpitations, orthopnea GI: denies melena, hematochezia, diarrhea, constipation, dysphagia, odyonophagia, early satiety or unintentional weight loss. +nausea +vomiting  MSK: Negative for joint pain or swelling, back pain, and muscle pain. Derm: Negative for itching or rash Psych: Denies depression, anxiety, memory loss, confusion. No homicidal or suicidal ideation.  Heme: Negative for prolonged bleeding, bruising easily, and swollen nodes. Endocrine: Negative for cold or heat intolerance, polyuria, polydipsia and goiter. Neuro: negative for tremor, gait imbalance, syncope and seizures. The remainder of the review of systems is noncontributory.  Physical Exam: BP (!) 153/107 (BP Location: Right Arm, Patient Position: Sitting, Cuff Size:  Normal)   Pulse 87   Temp 97.6 F (36.4 C) (Oral)   Ht 5\' 7"  (1.702 m)   Wt 153 lb 4.8 oz (69.5 kg)   BMI 24.01 kg/m  General:   Alert and oriented. No distress noted. Pleasant and cooperative.  Head:  Normocephalic and atraumatic. Eyes:  Conjuctiva clear without scleral icterus. Mouth:  Oral mucosa pink and moist. Good dentition. No lesions. Heart: Normal rate and rhythm, s1 and s2 heart sounds present.  Lungs: Clear lung sounds in all lobes. Respirations equal and unlabored. Abdomen:  +BS, soft, non-tender and non-distended. No rebound or guarding. No HSM or masses noted. Derm: No palmar erythema or jaundice Msk:  Symmetrical without gross deformities. Normal posture. Extremities:  Without edema. Neurologic:  Alert and  oriented x4 Psych:  Alert and cooperative. Normal mood and affect.  Invalid input(s): "6 MONTHS"   ASSESSMENT: TEMIKA SUTPHIN is a 26 y.o. female presenting today for follow up of ongoing nausea and  vomiting.   Patient with ongoing cyclic nausea and vomiting for the past year or so, recent EGD without findings to suggest etiology of her symptoms, nausea and vomiting are suspected secondary to marijuana use.  She has been unable to stop marijuana use completely.  Last smoked 4 days ago, and reports that she has had ongoing nausea and vomiting all weekend though tells me that symptoms have been worse over the past 3 weeks.  She has no antiemetics at home.  She is actively vomiting during visit today.  No hematemesis noted in emesis bag.  Given ongoing uncontrolled nausea and vomiting, I did recommend she proceed to the ER for evaluation of electrolyte imbalance, IV fluid rehydration and IV antiemetics.  Patient declined to go to the ER at this time stating issues with childcare.  And she declines to go to the ER I will send prescription for Phenergan 25 mg every 8 hours and dicyclomine 10 mg up to twice a day as she reports this has provided some results for her in the past.  I did make her aware that if symptoms do not improve with the use of these medications, if she feels dizzy or has a syncopal episode, if she has decreased or no urination, or is unable to keep down p.o. fluids she must proceed to the ER for further evaluation.  She can alternate water and low sugar electrolyte substances and make sure that urine is pale yellow to clear her to ensure she is well-hydrated.  discussed with her that ultimately she needs to have complete cessation of marijuana use.  We can proceed with cortisol testing and possible low-dose TCA if she is still symptomatic after 1 month of marijuana cessation.   PLAN:  Strongly Recommend pt proceed to the ER for IV fluid rehydration/IV anti emetics** 2. Rx phenergan 25mg  q8h  3. Rx dicyclomine 10mg  BID PRN 4. Complete MJ cessation** 5. Ensure urine is pale yellow to clear  6. If unable to keep POs down, not urinating, dizziness or syncope, must go to the ER asap! 7.  Can consider Cortisol testing/ TCA if symptoms persists after 1 month of MJ cessation1  All questions were answered, patient verbalized understanding and is in agreement with plan as outlined above.   Follow Up: 1 month   Jamie Maynard L. 27, MSN, APRN, AGNP-C Adult-Gerontology Nurse Practitioner [facility]  I have reviewed the note and agree with the APP's assessment as described in this progress note   Jamie Downer, MD Gastroenterology and [facility]

## 2022-10-15 NOTE — Patient Instructions (Addendum)
As discussed, I am strongly recommending you proceed to the ER For ongoing vomiting as I am concerned for dehydration/electrolyte imbalance  If you are not able to go to the ER at this time, please try to pick up the medications I have sent and star them as soon as possible, try to stay well hydrated with water and low sugar electrolyte substances, ensuring that you are urinating regularly with pale yellow to clear liquids. If you are not able to keep down fluids, urinating regularly, or feeling faint or lightheaded or you pass out, you certainly need to proceed to the emergency room as soon as possible. As discussed I would recommend complete cessation of marijuana use as this is likely the cause of your nausea and vomiting.  We can consider further testing and possible other medication to help control your symptoms once you have been off of marijuana for at least 1 month.  Follow up 1 month

## 2022-11-20 ENCOUNTER — Ambulatory Visit: Payer: Medicaid Other | Admitting: Gastroenterology

## 2023-08-01 ENCOUNTER — Ambulatory Visit (INDEPENDENT_AMBULATORY_CARE_PROVIDER_SITE_OTHER): Payer: MEDICAID | Admitting: Gastroenterology

## 2023-10-07 ENCOUNTER — Encounter (INDEPENDENT_AMBULATORY_CARE_PROVIDER_SITE_OTHER): Payer: Self-pay | Admitting: *Deleted

## 2023-11-25 ENCOUNTER — Telehealth (INDEPENDENT_AMBULATORY_CARE_PROVIDER_SITE_OTHER): Payer: Self-pay | Admitting: Gastroenterology

## 2023-11-25 ENCOUNTER — Encounter (INDEPENDENT_AMBULATORY_CARE_PROVIDER_SITE_OTHER): Payer: Self-pay | Admitting: Gastroenterology

## 2023-11-25 ENCOUNTER — Ambulatory Visit (INDEPENDENT_AMBULATORY_CARE_PROVIDER_SITE_OTHER): Payer: MEDICAID | Admitting: Gastroenterology

## 2023-11-25 VITALS — BP 110/70 | HR 83 | Temp 98.7°F | Ht 67.0 in | Wt 150.0 lb

## 2023-11-25 DIAGNOSIS — R6881 Early satiety: Secondary | ICD-10-CM

## 2023-11-25 DIAGNOSIS — K5900 Constipation, unspecified: Secondary | ICD-10-CM | POA: Diagnosis not present

## 2023-11-25 DIAGNOSIS — K921 Melena: Secondary | ICD-10-CM

## 2023-11-25 DIAGNOSIS — R112 Nausea with vomiting, unspecified: Secondary | ICD-10-CM

## 2023-11-25 DIAGNOSIS — F1290 Cannabis use, unspecified, uncomplicated: Secondary | ICD-10-CM

## 2023-11-25 DIAGNOSIS — K625 Hemorrhage of anus and rectum: Secondary | ICD-10-CM

## 2023-11-25 DIAGNOSIS — R1030 Lower abdominal pain, unspecified: Secondary | ICD-10-CM

## 2023-11-25 MED ORDER — PEG 3350-KCL-NA BICARB-NACL 420 G PO SOLR: 4000.0000 mL | Freq: Once | ORAL | 0 refills | Status: AC

## 2023-11-25 NOTE — Addendum Note (Signed)
Addended by: Marlowe Shores on: 11/25/2023 03:16 PM   Modules accepted: Orders

## 2023-11-25 NOTE — Patient Instructions (Signed)
We will get you scheduled for EGD and Colonoscopy for further evaluation of your symptoms Start taking Miralax (over the counter ) 1 capful (17g) every day for one week. If bowel movements do not improve, increase to 1 capful every 12 hours. If after two weeks there is no improvement, increase to 1 capful every 8 hours Please avoid marijuana and any other substances as these can worsen vomiting  Follow up 4 months

## 2023-11-25 NOTE — Progress Notes (Addendum)
Referring Provider: Richardean Chimera, MD Primary Care Physician:  Richardean Chimera, MD Primary GI Physician: Dr. Levon Hedger   Chief Complaint  Patient presents with   Abdominal Pain    Lower abdominal pain off and on for a couple of years. Sometimes vomits when pain is bad. Also has some constipation. Patient did ask to have pregnancy test done at visit.    HPI:   Jamie Maynard is a 28 y.o. 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  Still smoking marijuana.  Patient actively vomiting during encounter.  Patient recommended to proceed to the ER for IV fluid rehydration/IV anti emetics, Rx Phenergan 25 mg every 8 hours, dicyclomine 10 mg twice daily as needed, complete marijuana cessation, consider cortisol testing/TCA if symptoms persist after 1 month of marijuana cessation.  Present: Notably per chart review, multiple ED visits over the past 2 + years for nausea/vomiting thought secondary to cannabinoid hyperemesis syndrome   Patient states that she continues to have lower abdominal pain. Unable to wear anything tight on her stomach due to pain. Has constipation. Not taking anything for this. She has a BM maybe every other day or every 2 days, has to strain and has difficulty defecating. She notes that a few weeks ago she had an acute onset of rectal bleeding, noting this was so heavy she thought she began her menstrual cycle. She does endorse some abdominal pain at that times. Denies straining prior to that episode. Denies any rectal pain or discomfort.   She reports ongoing, intermittent nausea and vomiting. This occurs maybe 1-2 times per month and usually last  1-2 days at a time when it occurs. She is taking phenergan for nausea but does not think that this helps much. She has tried other anti emetics but has not had good results with many of them. She only reports GERD symptoms after a lot of vomiting. She notes 1 episode of darker stool about 3-4 months back when she was having an episode of nausea/vomiting. She denies any pepto bismol around that time. Denies NSAID use, no ETOH use. She feels that appetite is not great, has early satiety or will get very nauseated when she tries to eat more.   CT A/P with contrast: 05/2023 No CT evidence of acute abdominal/pelvic process.  2. Appendix not identified, however no inflammatory changes in the  pericecal region.  3. Bowel loops are normal in caliber. No evidence of colitis or  diverticulitis.  Last Colonoscopy:never Last Endoscopy:07/2022 Normal esophagus. - Normal stomach. - Normal examined duodenum. Biopsied-normal   Past Medical History:  Diagnosis Date   Medical history non-contributory     Past Surgical History:  Procedure Laterality Date   BIOPSY  07/13/2022   Procedure: BIOPSY;  Surgeon: Dolores Frame, MD;  [facility];  Service: Gastroenterology;;   DILATATION AND CURETTAGE/HYSTEROSCOPY WITH MINERVA N/A 02/08/2021   Procedure: DILATATION AND CURETTAGE/HYSTEROSCOPY WITH MINERVA;  Surgeon: Lazaro Arms, MD;  Location: [facility];  Service: Gynecology;  Laterality: N/A;   ESOPHAGOGASTRODUODENOSCOPY (EGD) WITH PROPOFOL N/A 07/13/2022   Procedure: ESOPHAGOGASTRODUODENOSCOPY (EGD) WITH PROPOFOL;  Surgeon: Dolores Frame, MD;  [facility];  Service: Gastroenterology;  Laterality: N/A;  945 ASA 2   IUD REMOVAL N/A 06/01/2020   Procedure: INTRAUTERINE DEVICE (IUD) REMOVAL;  Surgeon: Lazaro Arms, MD;  Location: [facility];  Service: Gynecology;  Laterality: N/A;   LAPAROSCOPIC TUBAL LIGATION Bilateral 06/01/2020   Procedure: LAPAROSCOPIC BILATERAL TUBAL LIGATION  USING ELECTROCAUTERY;  Surgeon: Lazaro Arms, MD;  Location: [facility];  Service: Gynecology;  Laterality: Bilateral;   TONSILLECTOMY AND ADENOIDECTOMY Bilateral 2003   Performed at [facility]    Current Outpatient Medications  Medication Sig Dispense Refill   dicyclomine (BENTYL) 10 MG capsule Take 1 capsule (10 mg total) by mouth 2 (two) times daily as needed for spasms. 60 capsule 1   ondansetron (ZOFRAN ODT) 8 MG disintegrating tablet Take 1 tablet (8 mg total) by mouth every 8 (eight) hours as needed for nausea or vomiting. (Patient not taking: Reported on 10/15/2022) 8 tablet 0   pantoprazole (PROTONIX) 40 MG tablet Take 1 tablet (40 mg total) by mouth daily. (Patient not taking: Reported on 10/15/2022) 30 tablet 0   promethazine (PHENERGAN) 25 MG tablet Take 1 tablet (25 mg total) by mouth every 8 (eight) hours as needed for nausea or vomiting. 30 tablet 1   No current facility-administered medications for this visit.    Allergies as of 11/25/2023 - Review Complete 11/25/2023  Allergen Reaction Noted   Other Hives and Rash 09/08/2015    Family History  Problem Relation Age of Onset   Depression Father    Stroke Father    Hypertension Father    Heart disease Father    Heart attack Maternal Grandfather    Congestive Heart Failure Paternal Grandmother    Migraines Paternal Grandmother    COPD Paternal Grandmother    Lung cancer Paternal Grandfather    Autism Cousin        2 paternal first cousins have autism   ADD / ADHD Cousin        Several paternal first cousins have ADHD/ADD    Social History   Socioeconomic History   Marital status: Single    Spouse name: Not on file   Number of children: 2   Years of education: Not on file   Highest education level: Not on file  Occupational History   Not on file  Tobacco Use   Smoking status: Never    Passive exposure: Never   Smokeless tobacco: Never  Vaping Use   Vaping status: Every Day  Substance and  Sexual Activity   Alcohol use: No   Drug use: Yes    Types: Marijuana   Sexual activity: Not Currently    Birth control/protection: Surgical    Comment: tubal  Other Topics Concern   Not on file  Social History Narrative    She lives with her father, 33 year old brother, and 68 year old daughter      Social Drivers of Corporate investment banker Strain: Low Risk  (07/10/2019)   Overall Financial Resource Strain (CARDIA)    Difficulty of Paying Living Expenses: Not hard at all  Food Insecurity: Unknown (07/10/2019)   Hunger Vital Sign    Worried About Programme researcher, broadcasting/film/video in the Last Year: Never true    Ran Out of Food in the Last Year: Not on file  Transportation Needs: Not on file  Physical Activity: Not on file  Stress: Not on file  Social Connections: Not on file    Review of systems General: negative for malaise, night sweats,  fever, chills, weight loss Neck: Negative for lumps, goiter, pain and significant neck swelling Resp: Negative for cough, wheezing, dyspnea at rest CV: Negative for chest pain, leg swelling, palpitations, orthopnea GI: denies diarrhea, dysphagia, odyonophagia, or unintentional weight loss.  + Constipation, + rectal bleeding, + melena, + nausea/vomiting, + early satiety MSK: Negative for joint pain or swelling, back pain, and muscle pain. Derm: Negative for itching or rash Psych: Denies depression, anxiety, memory loss, confusion. No homicidal or suicidal ideation.  Heme: Negative for prolonged bleeding, bruising easily, and swollen nodes. Endocrine: Negative for cold or heat intolerance, polyuria, polydipsia and goiter. Neuro: negative for tremor, gait imbalance, syncope and seizures. The remainder of the review of systems is noncontributory.  Physical Exam: There were no vitals taken for this visit. General:   Alert and oriented. No distress noted. Pleasant and cooperative.  Head:  Normocephalic and atraumatic. Eyes:  Conjuctiva clear without  scleral icterus. Mouth:  Oral mucosa pink and moist. Good dentition. No lesions. Heart: Normal rate and rhythm, s1 and s2 heart sounds present.  Lungs: Clear lung sounds in all lobes. Respirations equal and unlabored. Abdomen:  +BS, soft, and non-distended. Mild TTP of diffuse lower abdomen. No rebound or guarding. No HSM or masses noted. Derm: No palmar erythema or jaundice Msk:  Symmetrical without gross deformities. Normal posture. Extremities:  Without edema. Neurologic:  Alert and  oriented x4 Psych:  Alert and cooperative. Normal mood and affect.  Invalid input(s): "6 MONTHS"   ASSESSMENT: Jamie Maynard is a 28 y.o. female presenting today for abdominal pain, nausea, vomiting, melena, rectal bleeding and constipation   Nausea/vomiting/melena/early satiety: Patient with long history of nausea and vomiting, thought previously secondary to cannabinoid hyperemesis in setting of marijuana use.  EGD in 2023 was unremarkable.  She reports continued intermittent episodes of nausea and vomiting. Notably, patient denies use of marijuana but does smell of substance upon her arrival today.  She now reports some early satiety with decreased appetite and an episode of melena about 3 to 4 months ago.  She denies any NSAIDs or EtOH.  Denies GERD symptoms.  She has had some weight loss over the past year and a half approximately 16 pounds.  At this time given episode of melena and early satiety with ongoing nausea vomiting would recommend proceeding with upper endoscopy as I cannot rule out PUD, gastritis, H. pylori, duodenitis.  I did encourage her to avoid marijuana and all substances at this time.  Constipation/lower abdominal pain/rectal bleeding: Patient reports new onset of constipation over the past 7 months, having harder stools.  She also endorses lower abdominal pain.  She has had 2 episodes of rectal bleeding with the first being quite heavy.  Given constipation rectal bleeding may be secondary  to hemorrhoids, however given she has never had a colonoscopy in setting of rectal bleeding would recommend proceeding colonoscopy to rule out other more serious causes of rectal bleeding.  For now we will start MiraLAX and check CMP and TSH to look for underlying causes of her constipation.   PLAN:  -CMP,TSH -schedule EGD and Colonoscopy ASA II  -Start taking Miralax 1 capful every day for one week. If bowel movements do not improve, increase to 1 capful every 12 hours. If after two weeks there is no improvement, increase to 1 capful every 8 hours -avoid MJ and all other substances -Increase water intake, aim for atleast 64 oz per day Increase fruits, veggies and whole grains, kiwi and prunes are  especially good for constipation  All questions were answered, patient verbalized understanding and is in agreement with plan as outlined above.   Follow Up: 4 months   Recie Cirrincione L. Jeanmarie Hubert, MSN, APRN, AGNP-C Adult-Gerontology Nurse Practitioner [facility]  I have reviewed the note and agree with the APP's assessment as described in this progress note  Katrinka Blazing, MD Gastroenterology and Hepatology [facility]

## 2023-11-25 NOTE — Telephone Encounter (Signed)
Pt left voicemail returning call. Returned call to patient. Pt scheduled. Instructions mailed to patient. Prep sent to pharmacy.

## 2023-11-25 NOTE — Addendum Note (Signed)
Addended by: Marlowe Shores on: 11/25/2023 03:09 PM   Modules accepted: Orders

## 2023-11-25 NOTE — Telephone Encounter (Signed)
Left message for pt to return call to schedule TCS/EGD ASA 2 Jamie Maynard.

## 2023-12-06 ENCOUNTER — Other Ambulatory Visit (INDEPENDENT_AMBULATORY_CARE_PROVIDER_SITE_OTHER): Payer: Self-pay | Admitting: Gastroenterology

## 2023-12-07 LAB — SPECIMEN STATUS REPORT

## 2023-12-09 LAB — COMPREHENSIVE METABOLIC PANEL
ALT: 13 [IU]/L (ref 0–32)
AST: 16 [IU]/L (ref 0–40)
Albumin: 5.2 g/dL — ABNORMAL HIGH (ref 4.0–5.0)
Alkaline Phosphatase: 77 [IU]/L (ref 44–121)
BUN/Creatinine Ratio: 14 (ref 9–23)
BUN: 12 mg/dL (ref 6–20)
Bilirubin Total: 0.4 mg/dL (ref 0.0–1.2)
CO2: 23 mmol/L (ref 20–29)
Calcium: 10.2 mg/dL (ref 8.7–10.2)
Chloride: 101 mmol/L (ref 96–106)
Creatinine, Ser: 0.85 mg/dL (ref 0.57–1.00)
Globulin, Total: 2.5 g/dL (ref 1.5–4.5)
Glucose: 142 mg/dL — ABNORMAL HIGH (ref 70–99)
Potassium: 3.9 mmol/L (ref 3.5–5.2)
Sodium: 143 mmol/L (ref 134–144)
Total Protein: 7.7 g/dL (ref 6.0–8.5)
eGFR: 97 mL/min/{1.73_m2} (ref >59)

## 2023-12-09 LAB — TSH: TSH: 0.442 u[IU]/mL — ABNORMAL LOW (ref 0.450–4.500)

## 2023-12-11 ENCOUNTER — Ambulatory Visit (HOSPITAL_COMMUNITY): Payer: MEDICAID | Admitting: Anesthesiology

## 2023-12-11 ENCOUNTER — Other Ambulatory Visit: Payer: Self-pay

## 2023-12-11 ENCOUNTER — Telehealth (INDEPENDENT_AMBULATORY_CARE_PROVIDER_SITE_OTHER): Payer: Self-pay | Admitting: *Deleted

## 2023-12-11 ENCOUNTER — Encounter (HOSPITAL_COMMUNITY)
Admission: RE | Disposition: A | Discharge status: Home / Self Care | Payer: Self-pay | Source: Home / Self Care | Attending: Gastroenterology

## 2023-12-11 ENCOUNTER — Ambulatory Visit (HOSPITAL_COMMUNITY)
Admission: RE | Admit: 2023-12-11 | Discharge: 2023-12-11 | Disposition: A | Discharge status: Home / Self Care | Payer: MEDICAID | Attending: Gastroenterology | Admitting: Gastroenterology

## 2023-12-11 ENCOUNTER — Encounter (HOSPITAL_COMMUNITY): Payer: Self-pay | Admitting: Gastroenterology

## 2023-12-11 DIAGNOSIS — K921 Melena: Secondary | ICD-10-CM

## 2023-12-11 DIAGNOSIS — K5900 Constipation, unspecified: Secondary | ICD-10-CM | POA: Diagnosis not present

## 2023-12-11 DIAGNOSIS — F17290 Nicotine dependence, other tobacco product, uncomplicated: Secondary | ICD-10-CM | POA: Diagnosis not present

## 2023-12-11 DIAGNOSIS — K648 Other hemorrhoids: Secondary | ICD-10-CM

## 2023-12-11 DIAGNOSIS — R112 Nausea with vomiting, unspecified: Secondary | ICD-10-CM | POA: Insufficient documentation

## 2023-12-11 DIAGNOSIS — R194 Change in bowel habit: Secondary | ICD-10-CM

## 2023-12-11 DIAGNOSIS — F1290 Cannabis use, unspecified, uncomplicated: Secondary | ICD-10-CM | POA: Insufficient documentation

## 2023-12-11 DIAGNOSIS — R6881 Early satiety: Secondary | ICD-10-CM

## 2023-12-11 DIAGNOSIS — R1030 Lower abdominal pain, unspecified: Secondary | ICD-10-CM | POA: Diagnosis present

## 2023-12-11 DIAGNOSIS — K625 Hemorrhage of anus and rectum: Secondary | ICD-10-CM

## 2023-12-11 DIAGNOSIS — Z79899 Other long term (current) drug therapy: Secondary | ICD-10-CM | POA: Diagnosis not present

## 2023-12-11 DIAGNOSIS — K3189 Other diseases of stomach and duodenum: Secondary | ICD-10-CM

## 2023-12-11 HISTORY — DX: Anxiety disorder, unspecified: F41.9

## 2023-12-11 HISTORY — PX: BIOPSY: SHX5522

## 2023-12-11 HISTORY — DX: Depression, unspecified: F32.A

## 2023-12-11 HISTORY — PX: COLONOSCOPY WITH PROPOFOL: SHX5780

## 2023-12-11 HISTORY — PX: ESOPHAGOGASTRODUODENOSCOPY (EGD) WITH PROPOFOL: SHX5813

## 2023-12-11 LAB — POCT PREGNANCY, URINE: Preg Test, Ur: NEGATIVE

## 2023-12-11 SURGERY — ESOPHAGOGASTRODUODENOSCOPY (EGD) WITH PROPOFOL
Anesthesia: General

## 2023-12-11 MED ORDER — ONDANSETRON HCL 4 MG PO TABS: 4.0000 mg | ORAL_TABLET | Freq: Three times a day (TID) | ORAL | 1 refills | Status: AC | PRN

## 2023-12-11 MED ORDER — PROPOFOL 500 MG/50ML IV EMUL
INTRAVENOUS | Status: DC | PRN
  Administered 2023-12-11: 250 ug/kg/min via INTRAVENOUS

## 2023-12-11 MED ORDER — LIDOCAINE HCL (CARDIAC) PF 100 MG/5ML IV SOSY
PREFILLED_SYRINGE | INTRAVENOUS | Status: DC | PRN
  Administered 2023-12-11: 60 mg via INTRAVENOUS

## 2023-12-11 MED ORDER — PROPOFOL 10 MG/ML IV BOLUS
INTRAVENOUS | Status: DC | PRN
  Administered 2023-12-11: 100 mg via INTRAVENOUS

## 2023-12-11 MED ORDER — DEXMEDETOMIDINE HCL IN NACL 80 MCG/20ML IV SOLN
INTRAVENOUS | Status: DC | PRN
  Administered 2023-12-11: 20 ug via INTRAVENOUS

## 2023-12-11 MED ORDER — LACTATED RINGERS IV SOLN: INTRAVENOUS | Status: DC | PRN

## 2023-12-11 MED ORDER — DICYCLOMINE HCL 10 MG PO CAPS: 10.0000 mg | ORAL_CAPSULE | Freq: Two times a day (BID) | ORAL | 1 refills | Status: AC | PRN

## 2023-12-11 MED ORDER — LINACLOTIDE 72 MCG PO CAPS: 72.0000 ug | ORAL_CAPSULE | Freq: Every day | ORAL | 1 refills | Status: AC

## 2023-12-11 NOTE — Anesthesia Preprocedure Evaluation (Signed)
 Anesthesia Evaluation  Patient identified by MRN, date of birth, ID band Patient awake    Reviewed: Allergy & Precautions, NPO status , Patient's Chart, lab work & pertinent test results  Airway Mallampati: II  TM Distance: >3 FB Neck ROM: Full    Dental no notable dental hx. (+) Dental Advisory Given, Teeth Intact   Pulmonary neg pulmonary ROS   Pulmonary exam normal breath sounds clear to auscultation       Cardiovascular negative cardio ROS Normal cardiovascular exam Rhythm:Regular Rate:Normal     Neuro/Psych  PSYCHIATRIC DISORDERS  Depression    negative neurological ROS     GI/Hepatic negative GI ROS,,,(+)     substance abuse (smoked marijuana at around 6am)  marijuana use  Endo/Other  negative endocrine ROS    Renal/GU negative Renal ROS  negative genitourinary   Musculoskeletal negative musculoskeletal ROS (+)    Abdominal   Peds negative pediatric ROS (+)  Hematology negative hematology ROS (+)   Anesthesia Other Findings   Reproductive/Obstetrics negative OB ROS                             Anesthesia Physical Anesthesia Plan  ASA: 2  Anesthesia Plan: General   Post-op Pain Management: Minimal or no pain anticipated   Induction: Intravenous  PONV Risk Score and Plan: Propofol  infusion  Airway Management Planned: Nasal Cannula and Natural Airway  Additional Equipment: None  Intra-op Plan:   Post-operative Plan:   Informed Consent: I have reviewed the patients History and Physical, chart, labs and discussed the procedure including the risks, benefits and alternatives for the proposed anesthesia with the patient or authorized representative who has indicated his/her understanding and acceptance.     Dental advisory given  Plan Discussed with: CRNA  Anesthesia Plan Comments:         Anesthesia Quick Evaluation

## 2023-12-11 NOTE — Discharge Instructions (Addendum)
 You are being discharged to home.  Resume your previous diet.  Take Zofran  (ondansetron ) at 4 mg by mouth three times a day.  We are waiting for your pathology results.  Use Zofran  (ondansetron ) 4 mg PO TID as needed for nausea.  Bentyl  every 12 houras as needed for abdominal pain. COMPLETE MARIJUANA AVOIDANCE Start Linzess  72 mcg qday. Will need to reschedule colonoscopy in next available due to poor prep.

## 2023-12-11 NOTE — Telephone Encounter (Signed)
 Per TCS op note repeat TCS next available due to poor prep

## 2023-12-11 NOTE — Anesthesia Postprocedure Evaluation (Signed)
 Anesthesia Post Note  Patient: Jamie Maynard  Procedure(s) Performed: ESOPHAGOGASTRODUODENOSCOPY (EGD) WITH PROPOFOL  COLONOSCOPY WITH PROPOFOL  BIOPSY  Patient location during evaluation: PACU Anesthesia Type: General Level of consciousness: awake and alert Pain management: pain level controlled Vital Signs Assessment: post-procedure vital signs reviewed and stable Respiratory status: spontaneous breathing, nonlabored ventilation, respiratory function stable and patient connected to nasal cannula oxygen Cardiovascular status: blood pressure returned to baseline and stable Postop Assessment: no apparent nausea or vomiting Anesthetic complications: no   There were no known notable events for this encounter.   Last Vitals:  Vitals:   12/11/23 1347 12/11/23 1349  BP: (!) 82/40 (!) 96/46  Pulse: 76 78  Resp: (!) 21 20  Temp: 36.5 C   SpO2: 98% 96%    Last Pain:  Vitals:   12/11/23 1353  TempSrc:   PainSc: 0-No pain                 Taran Hable L Spike Desilets

## 2023-12-11 NOTE — Interval H&P Note (Signed)
 History and Physical Interval Note:  12/11/2023 1:12 PM  Jamie Maynard  has presented today for surgery, with the diagnosis of EARLY SATIETY, MELENA, RECTAL BLEEDING, CHANGE IN BOWEL HABITS.  The various methods of treatment have been discussed with the patient and family. After consideration of risks, benefits and other options for treatment, the patient has consented to  Procedure(s) with comments: ESOPHAGOGASTRODUODENOSCOPY (EGD) WITH PROPOFOL  (N/A) - 2:00pm;asa 2 COLONOSCOPY WITH PROPOFOL  (N/A) - 2:00pm;asa 2 as a surgical intervention.  The patient's history has been reviewed, patient examined, no change in status, stable for surgery.  I have reviewed the patient's chart and labs.  Questions were answered to the patient's satisfaction.     Elif Yonts Castaneda Mayorga

## 2023-12-11 NOTE — Op Note (Signed)
 [facility] Patient Name: Jamie Maynard Procedure Date: 12/11/2023 1:07 PM MRN: [id number] Date of Birth: 02/06/1996 Attending MD: Toribio Fortune , , [id number] CSN: [id number] Age: 28 Admit Type: Outpatient Procedure:                Upper GI endoscopy Indications:              Abdominal pain, Nausea with vomiting Providers:                Toribio Fortune, Jon LABOR. Gerome RN, RN, Bascom Blush Referring MD:              Medicines:                Monitored Anesthesia Care Complications:            No immediate complications. Estimated Blood Loss:     Estimated blood loss: none. Procedure:                Pre-Anesthesia Assessment:                           - Prior to the procedure, a History and Physical                            was performed, and patient medications, allergies                            and sensitivities were reviewed. The patient's                            tolerance of previous anesthesia was reviewed.                           - The risks and benefits of the procedure and the                            sedation options and risks were discussed with the                            patient. All questions were answered and informed                            consent was obtained.                           - ASA Grade Assessment: I - A normal, healthy                            patient.                           After obtaining informed consent, the endoscope was                            passed under direct vision. Throughout the  procedure, the patient's blood pressure, pulse, and                            oxygen saturations were monitored continuously. The                            [id number]-H190 ([id number]) scope was introduced through the                            mouth, and advanced to the second part of duodenum.                            The upper GI endoscopy was accomplished without                             difficulty. The patient tolerated the procedure                            well. Scope In: 1:28:50 PM Scope Out: 1:33:21 PM Total Procedure Duration: 0 hours 4 minutes 31 seconds  Findings:      The esophagus was normal.      The entire examined stomach was normal. Biopsies were taken with a cold       forceps for Helicobacter pylori testing.      The examined duodenum was normal. Biopsies were taken with a cold       forceps for histology. Impression:               - Normal esophagus.                           - Normal stomach. Biopsied.                           - Normal examined duodenum. Biopsied. Moderate Sedation:      Per Anesthesia Care Recommendation:           - Discharge patient to home (ambulatory).                           - Resume previous diet.                           - Use Zofran  (ondansetron ) 4 mg PO TID as needed                            for nausea.                           - Bentyl  every 12 houras as needed for abdominal                            pain.                           - COMPLETE MARIJUANA AVOIDANCE                           -  Await pathology results. Procedure Code(s):        --- Professional ---                           [phone], Esophagogastroduodenoscopy, flexible,                            transoral; with biopsy, single or multiple Diagnosis Code(s):        --- Professional ---                           R10.9, Unspecified abdominal pain                           R11.2, Nausea with vomiting, unspecified CPT copyright 2022 American Medical Association. All rights reserved. The codes documented in this report are preliminary and upon coder review may  be revised to meet current compliance requirements. Toribio Fortune, MD Toribio Fortune,  12/11/2023 1:37:37 PM This report has been signed electronically. Number of Addenda: 0

## 2023-12-11 NOTE — Transfer of Care (Signed)
 Immediate Anesthesia Transfer of Care Note  Patient: Richerd ONEIDA Lesches  Procedure(s) Performed: ESOPHAGOGASTRODUODENOSCOPY (EGD) WITH PROPOFOL  COLONOSCOPY WITH PROPOFOL  BIOPSY  Patient Location: Short Stay  Anesthesia Type:General  Level of Consciousness: awake, alert , oriented, and patient cooperative  Airway & Oxygen Therapy: Patient Spontanous Breathing  Post-op Assessment: Report given to RN, Post -op Vital signs reviewed and stable, and Patient moving all extremities X 4  Post vital signs: Reviewed and stable  Last Vitals:  Vitals Value Taken Time  BP 96/46 12/11/23 1349  Temp 36.5 C 12/11/23 1347  Pulse 78 12/11/23 1349  Resp 20 12/11/23 1349  SpO2 96 % 12/11/23 1349    Last Pain:  Vitals:   12/11/23 1347  TempSrc: Oral  PainSc:       Patients Stated Pain Goal: 8 (12/11/23 1307)  Complications: No notable events documented.

## 2023-12-11 NOTE — Op Note (Signed)
 [facility] Patient Name: Jamie Maynard Procedure Date: 12/11/2023 1:06 PM MRN: [id number] Date of Birth: 1996-05-27 Attending MD: Toribio Fortune , , [id number] CSN: [id number] Age: 28 Admit Type: Outpatient Procedure:                Flexible Sigmoidoscopy Indications:              Hematochezia Providers:                Toribio Fortune, Jon LABOR. Gerome RN, RN, Bascom Blush Referring MD:              Medicines:                Monitored Anesthesia Care Complications:            No immediate complications. Estimated Blood Loss:     Estimated blood loss: none. Procedure:                Pre-Anesthesia Assessment:                           - Prior to the procedure, a History and Physical                            was performed, and patient medications, allergies                            and sensitivities were reviewed. The patient's                            tolerance of previous anesthesia was reviewed.                           - The risks and benefits of the procedure and the                            sedation options and risks were discussed with the                            patient. All questions were answered and informed                            consent was obtained.                           - ASA Grade Assessment: I - A normal, healthy                            patient.                           After obtaining informed consent, the scope was                            passed under direct vision. The [id number]-[id number]                            (  [id number]) scope was introduced through the anus and                            advanced to the the sigmoid colon. After obtaining                            informed consent, the scope was passed under direct                            vision.The flexible sigmoidoscopy was accomplished                            without difficulty. The patient tolerated the                            procedure  well. The quality of the bowel                            preparation was poor. Scope In: 1:38:23 PM Scope Out: 1:40:40 PM Total Procedure Duration: 0 hours 2 minutes 17 seconds  Findings:      The perianal and digital rectal examinations were normal.      Extensive amounts of stool was found in the rectum and in the sigmoid       colon, precluding visualization.      Non-bleeding internal hemorrhoids were found during retroflexion. The       hemorrhoids were small. Impression:               - Preparation of the colon was poor.                           - Stool in the rectum and in the sigmoid colon.                           - Non-bleeding internal hemorrhoids.                           - No specimens collected. Moderate Sedation:      Per Anesthesia Care Recommendation:           - Discharge patient to home (ambulatory).                           - Resume previous diet.                           - Start Linzess  72 mcg qday.                           - Will need to reschedule colonoscopy in next                            available due to poor prep. Procedure Code(s):        --- Professional ---  54669, Sigmoidoscopy, flexible; diagnostic,                            including collection of specimen(s) by brushing or                            washing, when performed (separate procedure) Diagnosis Code(s):        --- Professional ---                           K64.8, Other hemorrhoids                           K92.1, Melena (includes Hematochezia) CPT copyright 2022 American Medical Association. All rights reserved. The codes documented in this report are preliminary and upon coder review may  be revised to meet current compliance requirements. Toribio Fortune, MD Toribio Fortune,  12/11/2023 1:46:02 PM This report has been signed electronically. Number of Addenda: 0

## 2023-12-12 ENCOUNTER — Encounter (HOSPITAL_COMMUNITY): Payer: Self-pay | Admitting: Gastroenterology

## 2023-12-12 LAB — SURGICAL PATHOLOGY

## 2023-12-12 NOTE — Telephone Encounter (Signed)
 Left message to return call

## 2023-12-13 ENCOUNTER — Encounter (INDEPENDENT_AMBULATORY_CARE_PROVIDER_SITE_OTHER): Payer: Self-pay

## 2023-12-17 MED ORDER — PEG 3350-KCL-NA BICARB-NACL 420 G PO SOLR: 4000.0000 mL | Freq: Once | ORAL | 0 refills | Status: AC

## 2023-12-17 NOTE — Telephone Encounter (Signed)
Pt contacted. Pt scheduled for 01/15/24 (originally I had asked pt if she was available for 01/06/24 but that day is unavailable for Dr.Castaneda so I have moved her to 01/15/24 and placed a note on her instructions). Instructions mailed to pt. Prep sent to pharmacy. Pa completed via [facility] portal.

## 2023-12-17 NOTE — Addendum Note (Signed)
Addended by: Marlowe Shores on: 12/17/2023 08:30 AM   Modules accepted: Orders

## 2023-12-19 ENCOUNTER — Encounter (INDEPENDENT_AMBULATORY_CARE_PROVIDER_SITE_OTHER): Payer: Self-pay | Admitting: *Deleted

## 2024-01-15 ENCOUNTER — Encounter (HOSPITAL_COMMUNITY): Payer: Self-pay | Admitting: Anesthesiology

## 2024-01-15 ENCOUNTER — Ambulatory Visit (HOSPITAL_COMMUNITY): Admission: RE | Admit: 2024-01-15 | Payer: MEDICAID | Source: Home / Self Care | Admitting: Gastroenterology

## 2024-01-15 ENCOUNTER — Encounter (HOSPITAL_COMMUNITY): Admission: RE | Payer: Self-pay | Source: Home / Self Care

## 2024-01-15 SURGERY — COLONOSCOPY WITH PROPOFOL
Anesthesia: Choice

## 2024-03-24 ENCOUNTER — Ambulatory Visit (INDEPENDENT_AMBULATORY_CARE_PROVIDER_SITE_OTHER): Payer: MEDICAID | Admitting: Gastroenterology

## 2024-06-04 ENCOUNTER — Encounter (HOSPITAL_COMMUNITY): Payer: Self-pay

## 2024-06-04 ENCOUNTER — Emergency Department (HOSPITAL_COMMUNITY)
Admission: EM | Admit: 2024-06-04 | Discharge: 2024-06-04 | Discharge status: Against Medical Advice | Payer: MEDICAID | Attending: Emergency Medicine | Admitting: Emergency Medicine

## 2024-06-04 DIAGNOSIS — R109 Unspecified abdominal pain: Secondary | ICD-10-CM | POA: Diagnosis present

## 2024-06-04 DIAGNOSIS — R110 Nausea: Secondary | ICD-10-CM | POA: Insufficient documentation

## 2024-06-04 DIAGNOSIS — Z5321 Procedure and treatment not carried out due to patient leaving prior to being seen by health care provider: Secondary | ICD-10-CM | POA: Insufficient documentation

## 2024-06-04 NOTE — ED Triage Notes (Signed)
 Pt comes in for n/v that has been going on all week. Pt states she cannot keep anything down. Pt is A&Ox4. Pt has her appendix and gallbladder. This has happened before, pt is normally given nausea meds and IV fluids and feels better.

## 2024-06-18 ENCOUNTER — Encounter (INDEPENDENT_AMBULATORY_CARE_PROVIDER_SITE_OTHER): Payer: Self-pay | Admitting: Gastroenterology

## 2024-06-18 ENCOUNTER — Ambulatory Visit (INDEPENDENT_AMBULATORY_CARE_PROVIDER_SITE_OTHER): Payer: MEDICAID | Admitting: Gastroenterology

## 2024-08-19 ENCOUNTER — Encounter (INDEPENDENT_AMBULATORY_CARE_PROVIDER_SITE_OTHER): Payer: Self-pay | Admitting: Gastroenterology
